# Patient Record
Sex: Male | Born: 1960 | Race: White | Hispanic: No | Smoking: Current every day smoker
Health system: Southern US, Community
[De-identification: ages and names within clinical notes are randomized; demographics above are authoritative.]

## PROBLEM LIST (undated history)

## (undated) DIAGNOSIS — E119 Type 2 diabetes mellitus without complications: Secondary | ICD-10-CM

## (undated) DIAGNOSIS — I10 Essential (primary) hypertension: Secondary | ICD-10-CM

## (undated) DIAGNOSIS — F32A Depression, unspecified: Secondary | ICD-10-CM

## (undated) DIAGNOSIS — M1990 Unspecified osteoarthritis, unspecified site: Secondary | ICD-10-CM

## (undated) DIAGNOSIS — IMO0001 Reserved for inherently not codable concepts without codable children: Secondary | ICD-10-CM

## (undated) DIAGNOSIS — F329 Major depressive disorder, single episode, unspecified: Secondary | ICD-10-CM

## (undated) DIAGNOSIS — K219 Gastro-esophageal reflux disease without esophagitis: Secondary | ICD-10-CM

## (undated) DIAGNOSIS — E785 Hyperlipidemia, unspecified: Secondary | ICD-10-CM

## (undated) DIAGNOSIS — R0602 Shortness of breath: Secondary | ICD-10-CM

## (undated) DIAGNOSIS — N200 Calculus of kidney: Secondary | ICD-10-CM

## (undated) HISTORY — PX: CYSTOSCOPY: SUR368

## (undated) HISTORY — PX: TONSILLECTOMY: SUR1361

## (undated) HISTORY — PX: CHOLECYSTECTOMY: SHX55

---

## 2013-04-25 ENCOUNTER — Emergency Department (HOSPITAL_COMMUNITY)
Admission: EM | Admit: 2013-04-25 | Discharge: 2013-04-26 | Disposition: A | Discharge status: Other Acute Inpatient Hospital | Payer: Self-pay | Attending: Emergency Medicine | Admitting: Emergency Medicine

## 2013-04-25 ENCOUNTER — Encounter (HOSPITAL_COMMUNITY): Payer: Self-pay | Admitting: Emergency Medicine

## 2013-04-25 DIAGNOSIS — F17200 Nicotine dependence, unspecified, uncomplicated: Secondary | ICD-10-CM | POA: Insufficient documentation

## 2013-04-25 DIAGNOSIS — M1990 Unspecified osteoarthritis, unspecified site: Secondary | ICD-10-CM | POA: Insufficient documentation

## 2013-04-25 DIAGNOSIS — R45851 Suicidal ideations: Secondary | ICD-10-CM

## 2013-04-25 DIAGNOSIS — F1910 Other psychoactive substance abuse, uncomplicated: Secondary | ICD-10-CM | POA: Insufficient documentation

## 2013-04-25 DIAGNOSIS — I10 Essential (primary) hypertension: Secondary | ICD-10-CM | POA: Insufficient documentation

## 2013-04-25 DIAGNOSIS — E119 Type 2 diabetes mellitus without complications: Secondary | ICD-10-CM | POA: Insufficient documentation

## 2013-04-25 DIAGNOSIS — Z79899 Other long term (current) drug therapy: Secondary | ICD-10-CM | POA: Insufficient documentation

## 2013-04-25 HISTORY — DX: Essential (primary) hypertension: I10

## 2013-04-25 HISTORY — DX: Type 2 diabetes mellitus without complications: E11.9

## 2013-04-25 HISTORY — DX: Unspecified osteoarthritis, unspecified site: M19.90

## 2013-04-25 LAB — COMPREHENSIVE METABOLIC PANEL
ALT: 24 U/L (ref 0–53)
AST: 14 U/L (ref 0–37)
Albumin: 4 g/dL (ref 3.5–5.2)
CO2: 26 mEq/L (ref 19–32)
Calcium: 10 mg/dL (ref 8.4–10.5)
Creatinine, Ser: 0.59 mg/dL (ref 0.50–1.35)
GFR calc non Af Amer: >90 mL/min (ref >90)
Sodium: 135 mEq/L (ref 135–145)
Total Protein: 7.5 g/dL (ref 6.0–8.3)

## 2013-04-25 LAB — CBC WITH DIFFERENTIAL/PLATELET
Basophils Absolute: 0 10*3/uL (ref 0.0–0.1)
Basophils Relative: 0 % (ref 0–1)
Eosinophils Absolute: 0.4 10*3/uL (ref 0.0–0.7)
Eosinophils Relative: 3 % (ref 0–5)
HCT: 47.2 % (ref 39.0–52.0)
MCH: 30.5 pg (ref 26.0–34.0)
MCHC: 34.5 g/dL (ref 30.0–36.0)
MCV: 88.4 fL (ref 78.0–100.0)
Monocytes Absolute: 1.1 10*3/uL — ABNORMAL HIGH (ref 0.1–1.0)
Platelets: 248 10*3/uL (ref 150–400)
RDW: 13.8 % (ref 11.5–15.5)
WBC: 13.3 10*3/uL — ABNORMAL HIGH (ref 4.0–10.5)

## 2013-04-25 LAB — RAPID URINE DRUG SCREEN, HOSP PERFORMED
Amphetamines: NOT DETECTED
Cocaine: NOT DETECTED
Opiates: NOT DETECTED

## 2013-04-25 LAB — ACETAMINOPHEN LEVEL: Acetaminophen (Tylenol), Serum: <15 ug/mL (ref 10–30)

## 2013-04-25 MED ORDER — LORAZEPAM 1 MG PO TABS
1.0000 mg | ORAL_TABLET | Freq: Once | ORAL | Status: AC
  Administered 2013-04-25: 1 mg via ORAL
  Filled 2013-04-25: qty 1

## 2013-04-25 MED ORDER — NICOTINE 21 MG/24HR TD PT24
21.0000 mg | MEDICATED_PATCH | Freq: Every day | TRANSDERMAL | Status: DC
  Administered 2013-04-25 – 2013-04-26 (×2): 21 mg via TRANSDERMAL
  Filled 2013-04-25 (×2): qty 1

## 2013-04-25 MED ORDER — ACETAMINOPHEN 325 MG PO TABS: 650.0000 mg | ORAL_TABLET | ORAL | Status: DC | PRN

## 2013-04-25 MED ORDER — LISINOPRIL 20 MG PO TABS
20.0000 mg | ORAL_TABLET | Freq: Every day | ORAL | Status: DC
  Administered 2013-04-25 – 2013-04-26 (×2): 20 mg via ORAL
  Filled 2013-04-25 (×2): qty 1

## 2013-04-25 MED ORDER — IBUPROFEN 200 MG PO TABS: 600.0000 mg | ORAL_TABLET | Freq: Three times a day (TID) | ORAL | Status: DC | PRN

## 2013-04-25 MED ORDER — LORAZEPAM 2 MG/ML IJ SOLN: 1.0000 mg | Freq: Once | INTRAMUSCULAR | Status: DC

## 2013-04-25 MED ORDER — ONDANSETRON HCL 4 MG PO TABS: 4.0000 mg | ORAL_TABLET | Freq: Three times a day (TID) | ORAL | Status: DC | PRN

## 2013-04-25 MED ORDER — ZOLPIDEM TARTRATE 5 MG PO TABS
5.0000 mg | ORAL_TABLET | Freq: Every evening | ORAL | Status: DC | PRN
  Administered 2013-04-25: 5 mg via ORAL
  Filled 2013-04-25: qty 1

## 2013-04-25 MED ORDER — METFORMIN HCL ER 500 MG PO TB24: 1000.0000 mg | ORAL_TABLET | Freq: Every day | ORAL | Status: DC

## 2013-04-25 MED ORDER — ALUM & MAG HYDROXIDE-SIMETH 200-200-20 MG/5ML PO SUSP: 30.0000 mL | ORAL | Status: DC | PRN

## 2013-04-25 MED ORDER — METFORMIN HCL ER 500 MG PO TB24
1000.0000 mg | ORAL_TABLET | Freq: Every day | ORAL | Status: DC
  Filled 2013-04-25: qty 2

## 2013-04-25 MED ORDER — MOMETASONE FURO-FORMOTEROL FUM 100-5 MCG/ACT IN AERO
2.0000 | INHALATION_SPRAY | Freq: Two times a day (BID) | RESPIRATORY_TRACT | Status: DC
  Administered 2013-04-25 – 2013-04-26 (×2): 2 via RESPIRATORY_TRACT
  Filled 2013-04-25: qty 8.8

## 2013-04-25 NOTE — ED Provider Notes (Signed)
  CSN: [id number]     Arrival date & time 04/25/13  1744 History     First MD Initiated Contact with Patient 04/25/13 1747     Chief Complaint  Patient presents with  . Suicidal   (Consider location/radiation/quality/duration/timing/severity/associated sxs/prior Treatment) HPI  Victor Lawrence is a 52 y.o.male with a significant PMH of diabetes, hypertension and arthritis presents to the ER with complaints of suicidal ideation of plan using rope and taking a large amount of pills. Patient informs me that he is at the end of his rope and has nothing to live for. He says that he has lost everything his job, his home, his marriage, his family. 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 He feels that he is really tired and does not have energy to get better. Inform me that if he was not here at [facility] and he would not be on this earth anymore.   Past Medical History  Diagnosis Date  . Hypertension   . Diabetes mellitus without complication   . Arthritis    Past Surgical History  Procedure Laterality Date  . Cholecystectomy    . Tonsillectomy     No family history on file. History  Substance Use Topics  . Smoking status: Current Every Day Smoker  . Smokeless tobacco: Not on file  . Alcohol Use: Yes    Review of Systems ROS is negative unless otherwise stated in the HPI  Allergies  Review of patient's allergies indicates no known allergies.  Home Medications   Current Outpatient Rx  Name  Route  Sig  Dispense  Refill  . lisinopril (PRINIVIL,ZESTRIL) 20 MG tablet   Oral   Take 20 mg by mouth daily.         . metformin (FORTAMET) 500 MG (OSM) 24 hr tablet   Oral   Take 1,000 mg by mouth at bedtime.          BP 153/87  Pulse 121  Temp(Src) 98 F (36.7 C) (Oral)  Resp 20  SpO2 94% Physical Exam  Nursing  note and vitals reviewed. Constitutional: He appears well-developed and well-nourished. No distress.  HENT:  Head: Normocephalic and atraumatic.   HEENT: Anicteric.  No pallor.  No discharge from ears, eyes, nose, or mouth.   Eyes: Pupils are equal, round, and reactive to light.  Neck: Normal range of motion. Neck supple.  Cardiovascular: Normal rate and regular rhythm.   Pulmonary/Chest: Effort normal.  Abdominal: Soft.  Neurological: He is alert.  Skin: Skin is warm and dry.  Psychiatric: He exhibits a depressed mood.    ED Course   Procedures (including critical care time)  Labs Reviewed  CBC WITH DIFFERENTIAL  COMPREHENSIVE METABOLIC PANEL  SALICYLATE LEVEL  ACETAMINOPHEN LEVEL  URINE RAPID DRUG SCREEN (HOSP PERFORMED)  ETHANOL   No results found. 1. Diabetes   2. Suicidal ideations   3. Hypertension     MDM   ACT consulted Holding orders placed Home Med reviewed Sitter bedside ordered.   Dorthula Matas, PA-C 04/25/13 1824

## 2013-04-25 NOTE — BH Assessment (Signed)
Assessment Note  Victor Lawrence is an 52 y.o. male presents as depressed but cooperative.  Pt reports that he currently lives with his sister and can return once he is medically stable.  Pt reports that "I just want to go to sleep and not wake up, I've lost everything and I just don't see any point anymore". "I quit my job, I've lost my wife, and I'm living with my sister!" Pt reports history of depression but states, "It's never been this bad, I've never gotten this far in my thoughts and my plan".  "I bought rope and picked out a tree to hang it from". Pt reports that "every addiction I could have is spiraling out of control, I'm gambling, I'm eating, and I'm self medicating with what ever I can get".  Pt is unable to contract for safety and states "I have nothing to live for and I just don't care, I feel blank and dark".  Pt reports that "recently I thought about getting a concealed weapons permit even though I'm not a gun person".      Pt denies HI/AH/VH. 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   Pt reports a long history of drug abuse since his teens to include acid, cocaine, pot, beer, benzos, oxycotin,  And "everything else I could do".  He reports that  Currently he self medicates and mainly uses opiates and marijuana.   Pt reports  That he has a history of diagnosis of major depressive disorder and panic disorder.  He has not been compliant with his medications (Prozac and Buspar) for at least 5 months, and reports "I don't even take my diabetes medicine and my high blood pressure medicine regularly because I don't see any point".     Pt referred for a psych consult for final disposition.  Axis I: Depressive Disorder NOS, Generalized Anxiety Disorder and Substance Abuse Axis II:  Deferred Axis III:  Past Medical History  Diagnosis Date  . Hypertension   . Diabetes mellitus without complication   . Arthritis    Axis IV: economic problems, housing problems, occupational problems, other psychosocial or environmental problems, problems related to social environment and problems with primary support group Axis V: 11-20 some danger of hurting self or others possible OR occasionally fails to maintain minimal personal hygiene OR gross impairment in communication  Past Medical History:  Past Medical History  Diagnosis Date  . Hypertension   . Diabetes mellitus without complication   . Arthritis     Past Surgical History  Procedure Laterality Date  . Cholecystectomy    . Tonsillectomy      Family History: No family history on file.  Social History:  reports that he has been smoking.  He does not have any smokeless tobacco history on file. He reports that  drinks alcohol. He reports that he uses illicit drugs.  Additional Social History:  Alcohol / Drug Use History of alcohol / drug use?: Yes Substance #1 Name of Substance 1: Marijuana 1 - Age of First Use: 12 1 - Amount (size/oz): few ounces 1 - Frequency: daily 1 - Duration: years 1 - Last Use / Amount: today Substance #2 Name of Substance 2: Alcohol 2 -  Age of First Use: teens 2 - Amount (size/oz): 12 oz beer 2 - Frequency: occasionally 2 - Duration: years 2 - Last Use / Amount: today Substance #3 Name of Substance 3: opiates 3 - Age of First Use: teens 3 - Amount (size/oz): varies 3 - Frequency: ongoing 3 - Duration: ongoing 3 - Last Use / Amount: last sunday  CIWA: CIWA-Ar BP: 135/77 mmHg Pulse Rate: 107 COWS:    Allergies: No Known Allergies  Home Medications:  (Not in a hospital admission)  OB/GYN Status:  No LMP for male patient.  General Assessment Data Location of Assessment: [facility] ED Is this a Tele or Face-to-Face Assessment?: Face-to-Face Is this an Initial Assessment or a  Re-assessment for this encounter?: Initial Assessment Living Arrangements: Other relatives Can pt return to current living arrangement?: Yes Admission Status: Voluntary Is patient capable of signing voluntary admission?: Yes Transfer from: Home Referral Source: Self/Family/Friend     [facility] Crisis Care Plan Living Arrangements: Other relatives     Risk to self Suicidal Ideation: Yes-Currently Present Suicidal Intent: Yes-Currently Present Is patient at risk for suicide?: Yes Suicidal Plan?: Yes-Currently Present Specify Current Suicidal Plan:  (Rope in a tree and pills) Access to Means: Yes Specify Access to Suicidal Means:  (Pt has already gotten rope & picked out tree/ has pills home) What has been your use of drugs/alcohol within the last 12 months?:  (benzos/oxycotin/marijuana/cocaine/acid/etoh/other rx pills) Previous Attempts/Gestures: Yes How many times?: 1 (2000) Triggers for Past Attempts: Unpredictable Intentional Self Injurious Behavior: None Family Suicide History: Unknown Recent stressful life event(s): Conflict (Comment);Divorce;Job Loss;Financial Problems Persecutory voices/beliefs?: No Depression: Yes Depression Symptoms: Insomnia;Isolating;Guilt;Loss of interest in usual pleasures;Feeling worthless/self pity Substance abuse history and/or treatment for substance abuse?: Yes  Risk to Others Homicidal Ideation: No Thoughts of Harm to Others: No Current Homicidal Intent: No Current Homicidal Plan: No Access to Homicidal Means: No Identified Victim: none History of harm to others?: No Assessment of Violence: None Noted Does patient have access to weapons?: No Criminal Charges Pending?: No Does patient have a court date: No  Psychosis Hallucinations: None noted Delusions: None noted  Mental Status Report Appear/Hygiene: Disheveled Eye Contact: Fair Motor Activity: Freedom of movement Speech: Logical/coherent Level of Consciousness:  Alert;Restless Mood: Depressed;Ashamed/humiliated;Despair;Empty;Guilty;Sad Affect: Depressed Anxiety Level: Minimal Thought Processes: Coherent;Relevant Judgement: Impaired Orientation: Person;Place;Time;Situation Obsessive Compulsive Thoughts/Behaviors: None  Cognitive Functioning Concentration: Decreased Memory: Recent Intact;Remote Intact IQ: Average Insight: Poor Impulse Control: Poor Appetite: Good Weight Loss: 0 Weight Gain: 40 (Pt reports gaining 40 lbs in last year) Sleep: Decreased Total Hours of Sleep: 3 Vegetative Symptoms: None  ADLScreening [facility] Assessment Services) Patient's cognitive ability adequate to safely complete daily activities?: Yes Patient able to express need for assistance with ADLs?: Yes Independently performs ADLs?: Yes (appropriate for developmental age)  Prior Inpatient Therapy Prior Inpatient Therapy: Yes Prior Therapy Dates: 2000 Prior Therapy Facilty/Provider(s):  (pt [place] Reason for Treatment:  (Depression/Substance Abuse)  Prior Outpatient Therapy Prior Outpatient Therapy: No  ADL Screening (condition at time of admission) Patient's cognitive ability adequate to safely complete daily activities?: Yes Patient able to express need for assistance with ADLs?: Yes Independently performs ADLs?: Yes (appropriate for developmental age)         Values / Beliefs Cultural Requests During Hospitalization: None Spiritual Requests During Hospitalization: None        Additional Information Does patient have medical clearance?: Yes     Disposition:  Disposition Initial Assessment Completed for this Encounter: Yes Disposition  of Patient: Other dispositions (Disposition pending psych consult) Other disposition(s): Other (Comment) (Disposition pending psych consult)  On Site Evaluation by:   Reviewed with Physician:    Lexine Baton 04/25/2013 7:55 PM

## 2013-04-25 NOTE — ED Notes (Signed)
Pt states he has suffered from depression for 25 years. Pt states 3 years ago he lost his wife and career and lost his whole life. Pt states since then he has really struggled with the depression he has been on Prozac and Buspar together and that is the medication that seemed to work the best. He ran out of his medications a while ago and he just didn't care enough to get it refilled.  Pt states his depression has become severe the past week and has really contemplated suicide. 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 Pt states he used to be addicted to both 20 years ago. Pt states he was committed to inpatient a couple times in his lifetime. He has seen psychiatrist in the past  For depression but not on a regular  Basis.

## 2013-04-25 NOTE — ED Notes (Addendum)
Pt states he is having suicidal thoughts with a plan and states he has the stuff at home(pills and a rope). This is not his first attempts, last attempt 2001. Pt also states he use opiates but haven't in a few days and smoked marijuana the other day.

## 2013-04-25 NOTE — ED Notes (Signed)
Pt wanded by security. 

## 2013-04-26 ENCOUNTER — Encounter (HOSPITAL_COMMUNITY): Payer: Self-pay | Admitting: *Deleted

## 2013-04-26 ENCOUNTER — Inpatient Hospital Stay (HOSPITAL_COMMUNITY)
Admission: AD | Admit: 2013-04-26 | Discharge: 2013-04-30 | DRG: 885 | Disposition: A | Discharge status: Home / Self Care | Payer: Self-pay | Source: Intra-hospital | Attending: Psychiatry | Admitting: Psychiatry

## 2013-04-26 DIAGNOSIS — F1920 Other psychoactive substance dependence, uncomplicated: Secondary | ICD-10-CM | POA: Diagnosis present

## 2013-04-26 DIAGNOSIS — F1120 Opioid dependence, uncomplicated: Secondary | ICD-10-CM

## 2013-04-26 DIAGNOSIS — F1994 Other psychoactive substance use, unspecified with psychoactive substance-induced mood disorder: Secondary | ICD-10-CM

## 2013-04-26 DIAGNOSIS — I10 Essential (primary) hypertension: Secondary | ICD-10-CM | POA: Diagnosis present

## 2013-04-26 DIAGNOSIS — R45851 Suicidal ideations: Secondary | ICD-10-CM

## 2013-04-26 DIAGNOSIS — F1210 Cannabis abuse, uncomplicated: Secondary | ICD-10-CM

## 2013-04-26 DIAGNOSIS — F1021 Alcohol dependence, in remission: Secondary | ICD-10-CM

## 2013-04-26 DIAGNOSIS — F332 Major depressive disorder, recurrent severe without psychotic features: Principal | ICD-10-CM | POA: Diagnosis present

## 2013-04-26 DIAGNOSIS — E119 Type 2 diabetes mellitus without complications: Secondary | ICD-10-CM | POA: Diagnosis present

## 2013-04-26 DIAGNOSIS — F411 Generalized anxiety disorder: Secondary | ICD-10-CM | POA: Diagnosis present

## 2013-04-26 DIAGNOSIS — Z79899 Other long term (current) drug therapy: Secondary | ICD-10-CM

## 2013-04-26 DIAGNOSIS — F1220 Cannabis dependence, uncomplicated: Secondary | ICD-10-CM

## 2013-04-26 LAB — HEMOGLOBIN A1C: Mean Plasma Glucose: 240 mg/dL — ABNORMAL HIGH (ref <117)

## 2013-04-26 LAB — GLUCOSE, CAPILLARY: Glucose-Capillary: 179 mg/dL — ABNORMAL HIGH (ref 70–99)

## 2013-04-26 MED ORDER — LOPERAMIDE HCL 2 MG PO CAPS
2.0000 mg | ORAL_CAPSULE | ORAL | Status: DC | PRN
Start: 2013-04-26 — End: 2013-04-30

## 2013-04-26 MED ORDER — CHLORDIAZEPOXIDE HCL 25 MG PO CAPS: 25.0000 mg | ORAL_CAPSULE | Freq: Three times a day (TID) | ORAL | Status: DC | PRN

## 2013-04-26 MED ORDER — BUSPIRONE HCL 10 MG PO TABS
10.0000 mg | ORAL_TABLET | Freq: Three times a day (TID) | ORAL | Status: DC
  Administered 2013-04-26: 10 mg via ORAL
  Filled 2013-04-26: qty 1

## 2013-04-26 MED ORDER — DICYCLOMINE HCL 20 MG PO TABS: 20.0000 mg | ORAL_TABLET | Freq: Four times a day (QID) | ORAL | Status: DC | PRN

## 2013-04-26 MED ORDER — ONDANSETRON 4 MG PO TBDP: 4.0000 mg | ORAL_TABLET | Freq: Four times a day (QID) | ORAL | Status: DC | PRN

## 2013-04-26 MED ORDER — CLONIDINE HCL 0.1 MG PO TABS
0.1000 mg | ORAL_TABLET | Freq: Four times a day (QID) | ORAL | Status: DC
  Filled 2013-04-26 (×8): qty 1

## 2013-04-26 MED ORDER — CLONIDINE HCL 0.1 MG PO TABS: 0.1000 mg | ORAL_TABLET | Freq: Every day | ORAL | Status: DC

## 2013-04-26 MED ORDER — METFORMIN HCL ER 500 MG PO TB24
1000.0000 mg | ORAL_TABLET | Freq: Every day | ORAL | Status: DC
  Administered 2013-04-26 – 2013-04-29 (×4): 1000 mg via ORAL
  Filled 2013-04-26 (×7): qty 2

## 2013-04-26 MED ORDER — LISINOPRIL 20 MG PO TABS
20.0000 mg | ORAL_TABLET | Freq: Every day | ORAL | Status: DC
Start: 2013-04-26 — End: 2013-04-30
  Administered 2013-04-27 – 2013-04-30 (×4): 20 mg via ORAL
  Filled 2013-04-26 (×8): qty 1

## 2013-04-26 MED ORDER — MOMETASONE FURO-FORMOTEROL FUM 100-5 MCG/ACT IN AERO
2.0000 | INHALATION_SPRAY | Freq: Two times a day (BID) | RESPIRATORY_TRACT | Status: DC
  Administered 2013-04-26 – 2013-04-30 (×8): 2 via RESPIRATORY_TRACT
  Filled 2013-04-26: qty 8.8

## 2013-04-26 MED ORDER — METHOCARBAMOL 500 MG PO TABS: 500.0000 mg | ORAL_TABLET | Freq: Three times a day (TID) | ORAL | Status: DC | PRN

## 2013-04-26 MED ORDER — NAPROXEN 500 MG PO TABS: 500.0000 mg | ORAL_TABLET | Freq: Two times a day (BID) | ORAL | Status: DC | PRN

## 2013-04-26 MED ORDER — HYDROXYZINE HCL 25 MG PO TABS
25.0000 mg | ORAL_TABLET | Freq: Four times a day (QID) | ORAL | Status: DC | PRN
  Administered 2013-04-26 – 2013-04-28 (×3): 25 mg via ORAL
  Filled 2013-04-26 (×2): qty 1

## 2013-04-26 MED ORDER — FLUOXETINE HCL 20 MG PO CAPS
40.0000 mg | ORAL_CAPSULE | Freq: Every day | ORAL | Status: DC
  Administered 2013-04-26: 40 mg via ORAL
  Filled 2013-04-26: qty 2

## 2013-04-26 MED ORDER — CLONIDINE HCL 0.1 MG PO TABS: 0.1000 mg | ORAL_TABLET | ORAL | Status: DC

## 2013-04-26 NOTE — Progress Notes (Signed)
Patient did attend the evening speaker AA meeting.  

## 2013-04-26 NOTE — Consult Note (Signed)
[facility] Psychiatry Consult   Reason for Consult:  ED Referral Referring Physician:  ED Providers Victor Lawrence is an 52 y.o. male.  Assessment: AXIS I:  Substance Induced Mood Disorder/opiate dependence/thc abuse vs dependence/alcohol dependence in partial remissiion/Suicidal ideations AXIS II:  Deferred AXIS III:   Past Medical History  Diagnosis Date  . Hypertension   . Diabetes mellitus without complication   . Arthritis    AXIS IV:  economic problems -quit job without other employment AXIS V:  21-30 behavior considerably influenced by delusions or hallucinations OR serious impairment in judgment, communication OR inability to function in almost all areas  Plan:  Recommend psychiatric Inpatient admission when medically cleared.  Subjective:   Victor Lawrence is a 52 y.o. male patient admitted with severe suicidal obsessions related to relapse into addiction 2 years ago.He has had good response to prozac and buspar in past but failed to fill his rx when it ran out last month.States it seems any addictive behavior has begun to flourish in him since relapse-ie reports heavy gambling addiction to sweepstakes also  HPI:  See ED admission note/Axis I-V and Subjective above.Pt lives with his sister.He grew up in a dysfunctional family with the loss of his parents at age 52 and alcoholism and drug addictiion present thruout his family. HPI Elements:   Context:  per ed Admission note pt is suicidal.  Past Psychiatric History: Past Medical History  Diagnosis Date  . Hypertension   . Diabetes mellitus without complication   . Arthritis     reports that he has been smoking.  He does not have any smokeless tobacco history on file. He reports that  drinks alcohol. He reports that he uses illicit drugs. No family history on file. 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 rate 18, SpO2 95.00%.There is no height or weight on file to calculate BMI. Results for orders placed during the hospital encounter of 04/25/13 (from the past 72 hour(s))  URINE RAPID DRUG SCREEN (HOSP PERFORMED)     Status: Abnormal   Collection Time    04/25/13  6:27 PM      Result Value Range   Opiates NONE DETECTED  NONE DETECTED   Cocaine NONE DETECTED  NONE DETECTED   Benzodiazepines NONE DETECTED  NONE DETECTED   Amphetamines NONE DETECTED  NONE DETECTED   Tetrahydrocannabinol POSITIVE (*) NONE DETECTED   Barbiturates NONE DETECTED  NONE DETECTED   Comment:            DRUG SCREEN FOR MEDICAL PURPOSES     ONLY.  IF CONFIRMATION IS NEEDED     FOR ANY PURPOSE, NOTIFY LAB     WITHIN 5 DAYS.                LOWEST DETECTABLE LIMITS     FOR URINE DRUG SCREEN  Drug Class       Cutoff (ng/mL)     Amphetamine      1000     Barbiturate      200     Benzodiazepine   200     Tricyclics       300     Opiates          300     Cocaine          300     THC              50  CBC WITH DIFFERENTIAL     Status: Abnormal   Collection Time    04/25/13  7:41 PM      Result Value Range   WBC 13.3 (*) 4.0 - 10.5 K/uL   RBC 5.34  4.22 - 5.81 MIL/uL   Hemoglobin 16.3  13.0 - 17.0 g/dL   HCT 91.4  78.2 - 95.6 %   MCV 88.4  78.0 - 100.0 fL   MCH 30.5  26.0 - 34.0 pg   MCHC 34.5  30.0 - 36.0 g/dL   RDW 21.3  08.6 - 57.8 %   Platelets 248  150  - 400 K/uL   Neutrophils Relative % 58  43 - 77 %   Neutro Abs 7.8 (*) 1.7 - 7.7 K/uL   Lymphocytes Relative 31  12 - 46 %   Lymphs Abs 4.1 (*) 0.7 - 4.0 K/uL   Monocytes Relative 8  3 - 12 %   Monocytes Absolute 1.1 (*) 0.1 - 1.0 K/uL   Eosinophils Relative 3  0 - 5 %   Eosinophils Absolute 0.4  0.0 - 0.7 K/uL   Basophils Relative 0  0 - 1 %   Basophils Absolute 0.0  0.0 - 0.1 K/uL  COMPREHENSIVE METABOLIC PANEL     Status: Abnormal   Collection Time    04/25/13  7:41 PM      Result Value Range   Sodium 135  135 - 145 mEq/L   Potassium 4.2  3.5 - 5.1 mEq/L   Chloride 98  96 - 112 mEq/L   CO2 26  19 - 32 mEq/L   Glucose, Bld 186 (*) 70 - 99 mg/dL   BUN 15  6 - 23 mg/dL   Creatinine, Ser 4.69  0.50 - 1.35 mg/dL   Calcium 62.9  8.4 - 52.8 mg/dL   Total Protein 7.5  6.0 - 8.3 g/dL   Albumin 4.0  3.5 - 5.2 g/dL   AST 14  0 - 37 U/L   ALT 24  0 - 53 U/L   Alkaline Phosphatase 59  39 - 117 U/L   Total Bilirubin 0.4  0.3 - 1.2 mg/dL   GFR calc non Af Amer >90  >90 mL/min   GFR calc Af Amer >90  >90 mL/min   Comment: (NOTE)     The eGFR has been calculated using the CKD EPI equation.     This calculation has not been validated in all clinical situations.     eGFR's persistently <90 mL/min signify possible Chronic Kidney     Disease.  SALICYLATE LEVEL     Status: Abnormal   Collection Time    04/25/13  7:41 PM      Result Value Range   Salicylate Lvl <2.0 (*) 2.8 - 20.0 mg/dL  ACETAMINOPHEN LEVEL     Status: None   Collection Time    04/25/13  7:41  PM      Result Value Range   Acetaminophen (Tylenol), Serum <15.0  10 - 30 ug/mL   Comment:            THERAPEUTIC CONCENTRATIONS VARY     SIGNIFICANTLY. A RANGE OF 10-30     ug/mL MAY BE AN EFFECTIVE     CONCENTRATION FOR MANY PATIENTS.     HOWEVER, SOME ARE BEST TREATED     AT CONCENTRATIONS OUTSIDE THIS     RANGE.     ACETAMINOPHEN CONCENTRATIONS     >150 ug/mL AT 4 HOURS AFTER     INGESTION AND >50 ug/mL AT 12     HOURS  AFTER INGESTION ARE     OFTEN ASSOCIATED WITH TOXIC     REACTIONS.  ETHANOL     Status: None   Collection Time    04/25/13  7:41 PM      Result Value Range   Alcohol, Ethyl (B) <11  0 - 11 mg/dL   Comment:            LOWEST DETECTABLE LIMIT FOR     SERUM ALCOHOL IS 11 mg/dL     FOR MEDICAL PURPOSES ONLY   Labs are reviewed and are pertinent for THC in urine and ^ glucose  Current Facility-Administered Medications  Medication Dose Route Frequency Provider Last Rate Last Dose  . acetaminophen (TYLENOL) tablet 650 mg  650 mg Oral Q4H PRN Dorthula Matas, PA-C      . alum & mag hydroxide-simeth (MAALOX/MYLANTA) 200-200-20 MG/5ML suspension 30 mL  30 mL Oral PRN Dorthula Matas, PA-C      . ibuprofen (ADVIL,MOTRIN) tablet 600 mg  600 mg Oral Q8H PRN Dorthula Matas, PA-C      . lisinopril (PRINIVIL,ZESTRIL) tablet 20 mg  20 mg Oral Daily Dorthula Matas, PA-C   20 mg at 04/25/13 2217  . metFORMIN (GLUCOPHAGE-XR) 24 hr tablet 1,000 mg  1,000 mg Oral Q supper Tiffany Irine Seal, PA-C      . mometasone-formoterol (DULERA) 100-5 MCG/ACT inhaler 2 puff  2 puff Inhalation BID Dorthula Matas, PA-C   2 puff at 04/25/13 2213  . nicotine (NICODERM CQ - dosed in mg/24 hours) patch 21 mg  21 mg Transdermal Daily Dorthula Matas, PA-C   21 mg at 04/25/13 2021  . ondansetron (ZOFRAN) tablet 4 mg  4 mg Oral Q8H PRN Dorthula Matas, PA-C      . zolpidem (AMBIEN) tablet 5 mg  5 mg Oral QHS PRN Dorthula Matas, PA-C   5 mg at 04/25/13 2217   Current Outpatient Prescriptions  Medication Sig Dispense Refill  . lisinopril (PRINIVIL,ZESTRIL) 20 MG tablet Take 20 mg by mouth daily.      . metformin (FORTAMET) 500 MG (OSM) 24 hr tablet Take 1,000 mg by mouth at bedtime.      . mometasone-formoterol (DULERA) 100-5 MCG/ACT AERO Inhale 2 puffs into the lungs 2 (two) times daily.        Psychiatric Specialty Exam:     Blood pressure 100/62, pulse 87, temperature 98.3 F (36.8 C), temperature source Oral,  resp. rate 18, SpO2 95.00%.There is no height or weight on file to calculate BMI.  General Appearance: Fairly Groomed  Patent attorney::  Fair  Speech:  Clear and Coherent  Volume:  Normal  Mood:  Dysphoric/anergic  Affect:  Congruent  Thought Process:  Coherent  Orientation:  Full (Time, Place, and Person)  Thought Content:  Obsessions with suicidal thought  Suicidal Thoughts:  Yes.  with intent/plan  Homicidal Thoughts:  No  Memory:  Negative  Judgement:  Impaired  Insight:  Lacking  Psychomotor Activity:  Decreased  Concentration:  Fair  Recall:  Good  Akathisia:  NA  Handed:  Right  AIMS (if indicated):     Assets:  Social support  Sleep:   poor   Treatment Plan Summary: admit for inpatient traetment of addictions and depression  Court Joy 04/26/2013 3:43 AM

## 2013-04-26 NOTE — ED Provider Notes (Signed)
Medical screening examination/treatment/procedure(s) were performed by non-physician practitioner and as supervising physician I was immediately available for consultation/collaboration.   Junius Argyle, MD 04/26/13 [id number]

## 2013-04-26 NOTE — ED Notes (Signed)
Up to the bathroom, ambulatory w/o difficulty 

## 2013-04-26 NOTE — Progress Notes (Signed)
Patient ID: Victor Lawrence, male   DOB: 18-Aug-1961, 52 y.o.   MRN: [id number] [birth year] @ 1442 nursing admission: pt came to [facility] voluntarily with an admitting dx of depressive d/o nos, generalized anxiety d/o and substance abuse. He was suicidal with a plan to OD or hang himself. He was able to contract for SI on admission and denied any hi/av. Also stated he had been non complaint with his medications. He has been abusing opiates, MJ and etoh he stated, but mostly the opiates. Last use of these substances was approximately on 04-25-13.  He has a hx of diabetes and his CBG on admission was 179 at 1350. On admission he had no pain, stated he was constipated, wears glasses, wears upper dentures, is a smoker x2 ppd, wearing a nicotine patch and had no allergies. He is not a fall risk and has a medial hx of arthritis, diabetes, gallbladder removed, htn and his tonsils. He was polite/cooperative on adm and escorted to the 300 hall. Report was given to luanne,RN.

## 2013-04-26 NOTE — ED Notes (Signed)
Up to the bathroom 

## 2013-04-26 NOTE — ED Notes (Signed)
Psych and NP into see 

## 2013-04-26 NOTE — ED Notes (Signed)
Pt ambulatory to [facility] w/ security and mHt, belongings sent w/ mHt 

## 2013-04-26 NOTE — Tx Team (Signed)
Initial Interdisciplinary Treatment Plan  PATIENT STRENGTHS: (choose at least two) Average or above average intelligence Communication skills General fund of knowledge Motivation for treatment/growth Supportive family/friends  PATIENT STRESSORS: Marital or family conflict Substance abuse   PROBLEM LIST: Problem List/Patient Goals Date to be addressed Date deferred Reason deferred Estimated date of resolution  Depressive d/o  04-26-13     GAD 04-26-13     Substance abuse/dependece 04-26-13     si with plan  04-26-13                                    DISCHARGE CRITERIA:  Improved stabilization in mood, thinking, and/or behavior Reduction of life-threatening or endangering symptoms to within safe limits Verbal commitment to aftercare and medication compliance Withdrawal symptoms are absent or subacute and managed without 24-hour nursing intervention  PRELIMINARY DISCHARGE PLAN: Attend aftercare/continuing care group Attend 12-step recovery group Return to previous living arrangement  PATIENT/FAMIILY INVOLVEMENT: This treatment plan has been presented to and reviewed with the patient, Victor Lawrence, and/or family member, .  The patient and family have been given the opportunity to ask questions and make suggestions.  Victor Lawrence 04/26/2013, 2:42 PM

## 2013-04-26 NOTE — ED Notes (Signed)
NAD, watching tv, pt is aware that he will transport to [facility] shortly.

## 2013-04-26 NOTE — Consult Note (Signed)
Agree with plan 

## 2013-04-27 ENCOUNTER — Encounter (HOSPITAL_COMMUNITY): Payer: Self-pay | Admitting: Psychiatry

## 2013-04-27 DIAGNOSIS — F411 Generalized anxiety disorder: Secondary | ICD-10-CM

## 2013-04-27 DIAGNOSIS — F1920 Other psychoactive substance dependence, uncomplicated: Secondary | ICD-10-CM | POA: Diagnosis present

## 2013-04-27 DIAGNOSIS — F332 Major depressive disorder, recurrent severe without psychotic features: Secondary | ICD-10-CM | POA: Diagnosis present

## 2013-04-27 MED ORDER — FLUOXETINE HCL 20 MG PO CAPS
20.0000 mg | ORAL_CAPSULE | Freq: Every day | ORAL | Status: DC
  Administered 2013-04-27 – 2013-04-29 (×3): 20 mg via ORAL
  Filled 2013-04-27 (×5): qty 1

## 2013-04-27 MED ORDER — TRAZODONE HCL 100 MG PO TABS
100.0000 mg | ORAL_TABLET | Freq: Every evening | ORAL | Status: DC | PRN
  Administered 2013-04-27: 100 mg via ORAL
  Filled 2013-04-27: qty 1

## 2013-04-27 MED ORDER — HYDROXYZINE HCL 50 MG PO TABS
50.0000 mg | ORAL_TABLET | Freq: Every evening | ORAL | Status: DC | PRN
  Filled 2013-04-27: qty 14

## 2013-04-27 MED ORDER — NICOTINE 21 MG/24HR TD PT24
MEDICATED_PATCH | TRANSDERMAL | Status: AC
  Filled 2013-04-27: qty 1

## 2013-04-27 MED ORDER — NICOTINE 21 MG/24HR TD PT24
21.0000 mg | MEDICATED_PATCH | Freq: Every day | TRANSDERMAL | Status: DC
  Administered 2013-04-27: 21 mg via TRANSDERMAL
  Filled 2013-04-27 (×3): qty 1

## 2013-04-27 MED ORDER — BUSPIRONE HCL 5 MG PO TABS
15.0000 mg | ORAL_TABLET | Freq: Two times a day (BID) | ORAL | Status: DC
  Administered 2013-04-27 – 2013-04-30 (×7): 15 mg via ORAL
  Filled 2013-04-27 (×11): qty 1

## 2013-04-27 NOTE — [facility] Group Notes (Signed)
[facility] Group Notes: (Clinical Social Work)   04/27/2013      Type of Therapy:  Group Therapy   Participation Level:  Did Not Attend - came to the beginning of group, stated he does attend AA and that is his main support system, then left room and did not return.   Ambrose Mantle, LCSW 04/27/2013, 12:59 PM

## 2013-04-27 NOTE — Progress Notes (Addendum)
Patient ID: Victor Lawrence, male   DOB: 1960-11-28, 52 y.o.   MRN: [id number] D: Pt is awake and active on the unit this AM. Pt denies SI/HI and A/V hallucinations. Pt rates their depression at 9 and hopelessness at 9. Pt's most recent COWS score was 1. Pt mood is depressed and his affect is blunted. Pt has been preoccupied with getting his medications continued which was accomplished today. Pt is only attending a few groups and does not stay the entire duration. Pt is somewhat isolative but he is cooperative with staff.   A: Encouraged pt to discuss feelings with staff and administered medication per MD orders. Writer also encouraged pt to participate in groups.  R: Pt is attending groups and tolerating medications well. Writer will continue to monitor. 15 minute checks are ongoing for safety.

## 2013-04-27 NOTE — [facility] Suicide Risk Assessment (Signed)
Suicide Risk Assessment  Admission Assessment     Nursing information obtained from:  Patient Demographic factors:  Male;Divorced or widowed;Caucasian;Unemployed Current Mental Status:  Suicidal ideation indicated by patient Loss Factors:  Decrease in vocational status;Financial problems / change in socioeconomic status Historical Factors:  Family history of mental illness or substance abuse;Impulsivity Risk Reduction Factors:  Sense of responsibility to family;Religious beliefs about death;Living with another person, especially a relative;Positive social support  CLINICAL FACTORS:   Severe Anxiety and/or Agitation Depression:   Anhedonia Comorbid alcohol abuse/dependence Hopelessness Insomnia Severe Alcohol/Substance Abuse/Dependencies  COGNITIVE FEATURES THAT CONTRIBUTE TO RISK:  Closed-mindedness Polarized thinking Thought constriction (tunnel vision)    SUICIDE RISK:   Moderate:  Frequent suicidal ideation with limited intensity, and duration, some specificity in terms of plans, no associated intent, good self-control, limited dysphoria/symptomatology, some risk factors present, and identifiable protective factors, including available and accessible social support.  PLAN OF CARE: Supportive approach/coping skills/relapse prevention                                Identify Detox needs                               Reassess and address the co morbidities                               Resume Prozac/Buspar  I certify that inpatient services furnished can reasonably be expected to improve the patient's condition.  Ardit Danh A 04/27/2013, 2:48 PM
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
## 2013-04-27 NOTE — Progress Notes (Signed)
Patient did attend the evening speaker AA meeting.  

## 2013-04-27 NOTE — [facility] Notes (Signed)
Adult Psychoeducational Group Note  Date:  04/27/2013 Time:  0900  Group Topic/Focus:  Self Inventory  Participation Level:  Did Not Attend   Victor Lawrence 04/27/2013, 10:34 AM 
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
## 2013-04-27 NOTE — [facility] Notes (Signed)
Psychoeducational Group Note  Date:  04/27/2013 Time:  0945 am  Group Topic/Focus:  Making Healthy Choices:   The focus of this group is to help patients identify negative/unhealthy choices they were using prior to admission and identify positive/healthier coping strategies to replace them upon discharge.  Participation Level:  Active  Participation Quality:  Appropriate, Attentive, Sharing and Supportive  Affect:  Appropriate  Cognitive:  Alert and Appropriate  Insight:  Engaged  Engagement in Group:  Engaged  Additional Comments:    Andrena Mews 04/27/2013, 10:29 AM

## 2013-04-27 NOTE — [facility] Notes (Addendum)
Patient ID: Victor Lawrence, male   DOB: [birth year], 52 y.o.   MRN: [id number] D: Client resting in bed with eyes closed; unlabored breathing noted. C/o anxiety  A: Q 15 minute safety checks completed. Administered PRN vistaril.  R: Safety maintained. Pt returned to sleep.

## 2013-04-28 DIAGNOSIS — F1110 Opioid abuse, uncomplicated: Secondary | ICD-10-CM

## 2013-04-28 DIAGNOSIS — F329 Major depressive disorder, single episode, unspecified: Secondary | ICD-10-CM

## 2013-04-28 DIAGNOSIS — F1210 Cannabis abuse, uncomplicated: Secondary | ICD-10-CM

## 2013-04-28 MED ORDER — TRAZODONE HCL 50 MG PO TABS
50.0000 mg | ORAL_TABLET | Freq: Every evening | ORAL | Status: DC | PRN
  Administered 2013-04-28 – 2013-04-29 (×2): 50 mg via ORAL
  Filled 2013-04-28: qty 14
  Filled 2013-04-28: qty 1

## 2013-04-28 MED ORDER — NICOTINE 14 MG/24HR TD PT24
14.0000 mg | MEDICATED_PATCH | Freq: Every day | TRANSDERMAL | Status: DC
  Administered 2013-04-29 – 2013-04-30 (×2): 14 mg via TRANSDERMAL
  Filled 2013-04-28 (×6): qty 1

## 2013-04-28 MED ORDER — NICOTINE 14 MG/24HR TD PT24
MEDICATED_PATCH | TRANSDERMAL | Status: AC
  Administered 2013-04-28: 14 mg via TRANSDERMAL
  Filled 2013-04-28: qty 1

## 2013-04-28 NOTE — [facility] Notes (Signed)
Report received from Stefanie Libel RN. Patient currently lying in bed asleep with eyes closed and no distress noted. Safety maintained with 15 min checks, wll continue to monitor.

## 2013-04-28 NOTE — [facility] Notes (Signed)
Patient ID: Victor Lawrence, male   DOB: 1961-03-16, 52 y.o.   MRN: [id number] He has been up and to groups interacting with peers and staff. Stated that he still has fleeing thoughts to harm self. Says that" he would not act on them".  +

## 2013-04-28 NOTE — [facility] Group Notes (Signed)
[facility] LCSW [facility] Discharge Planning Group Note   04/28/2013 8:45 AM  Participation Quality:  Alert and Appropriate   Mood/Affect:  Appropriate  Depression Rating:  6-7  Anxiety Rating:  6-7  Thoughts of Suicide:  Pt denies SI/HI  Will you contract for safety?   Yes  Current AVH:  Pt denies  Plan for Discharge/Comments:  Pt attended discharge planning group and actively participated in group.  CSW provided pt with today's workbook.  Pt states that he came here for suicidal ideation.  Pt states that he lives in [place] and will return home.  Pt states that he is feeling better today.  Pt doesn't have follow up; CSW will assess for appropriate referrals.  No further needs voiced by pt at this time.    Transportation Means: Pt reports access to transportation  Supports: No supports mentioned at this time  Reyes Ivan, LCSWA 04/28/2013 9:41 AM

## 2013-04-28 NOTE — Tx Team (Signed)
Interdisciplinary Treatment Plan Update (Adult)  Date: 04/28/2013  Time Reviewed:  9:45 AM  Progress in Treatment: Attending groups: Yes Participating in groups:  Yes Taking medication as prescribed:  Yes Tolerating medication:  Yes Family/Significant othe contact made: CSW assessing Patient understands diagnosis:  Yes Discussing patient identified problems/goals with staff:  Yes Medical problems stabilized or resolved:  Yes Denies suicidal/homicidal ideation: Yes Issues/concerns per patient self-inventory:  Yes Other:  New problem(s) identified: N/A  Discharge Plan or Barriers: CSW assessing for appropriate referrals.   Reason for Continuation of Hospitalization:  Anxiety Depression Medication Stabilization  Comments: N/A  Estimated length of stay: 3-5 days  For review of initial/current patient goals, please see plan of care.  Attendees: Patient:     Family:     Physician:   04/28/2013 10:11 AM   Nursing:   Roswell Miners, RN 04/28/2013 10:11 AM   Clinical Social Worker:  Reyes Ivan, LCSWA 04/28/2013 10:11 AM   Other: Manuela Schwartz, RN 04/28/2013 10:11 AM   Other:  Trula Slade, LCSWA 04/28/2013 10:11 AM   Other:  Onnie Boer, RN case manager 04/28/2013 10:11 AM   Other:  Serena Colonel, NP 04/28/2013 10:11 AM   Other:    Other:    Other:    Other:    Other:    Other:     Scribe for Treatment Team:   Carmina Miller, 04/28/2013 , 10:11 AM

## 2013-04-28 NOTE — Clinical Social Work Note (Signed)
CSW met with pt individually after group. Pt states that he spoke to Dr. Dub Mikes yesterday about [facility], and that it should have been relayed to CSW today that this is what he wants.  CSW explained that Dr. Dub Mikes was off today but that it was fine, CSW would get info on [facility].  CSW then asked pt what brought him to the hospital and his substance use history, to best help him in making referrals.  Pt became very agitated, stating that CSW should have read his chart and already know what is going on with pt, prior to meeting with pt.  Pt requesting a new CSW.    Victor Lawrence, [place] 04/28/2013  2:55 PM

## 2013-04-28 NOTE — [facility] Group Notes (Signed)
[facility] LCSW Group Therapy  04/28/2013  1:15 PM   Type of Therapy:  Group Therapy  Participation Level:  Active  Participation Quality:  Appropriate and Attentive  Affect:  Appropriate  Cognitive:  Alert and Appropriate  Insight:  Developing/Improving and Engaged  Engagement in Therapy:  Developing/Improving and Engaged  Modes of Intervention:  Clarification, Confrontation, Discussion, Education, Exploration, Limit-setting, Orientation, Problem-solving, Rapport Building, Dance movement psychotherapist, Socialization and Support  Summary of Progress/Problems: Pt identified obstacles faced currently and processed barriers involved in overcoming these obstacles. Pt identified steps necessary for overcoming these obstacles and explored motivation (internal and external) for facing these difficulties head on. Pt further identified one area of concern in their lives and chose a goal to focus on for today.  Pt shared that his biggest obstacle is always his "thoughts when alone and his tongue when in a crowd".  Pt states that he needs to change his ways of thinking and accept help from the professionals, as he often thinks he is the expert and self diagnosis, which leads to self sabotage.  Pt states that he plans to be open and honest while here.  Pt actively participated and was engaged in group discussion.    Victor Lawrence, [place] 04/28/2013 2:24 PM

## 2013-04-28 NOTE — Progress Notes (Signed)
[facility] MD Progress Note  04/28/2013 6:36 PM Victor Lawrence  MRN:  [id number]  Subjective: Victor Lawrence reports, "My depression is lifting, but I feel frustrated. I'm focusing on getting well again. The Md informed me of a place that I can go for 1 year to get treatment. But the Social worker is the key person to get me the information about this place called [facility]. However, the social worker seem inattentive to my needs. The trazodone 100 mg is a bit much. I need the dose decreased".  Diagnosis:   DSM5: Schizophrenia Disorders:   Obsessive-Compulsive Disorders:   Trauma-Stressor Disorders:   Substance/Addictive Disorders:  Alcohol Withdrawal (291.81), Cannabis Use Disorder - Moderate 9304.30) and Opioid Disorder - Moderate (304.00) Depressive Disorders:  Major Depressive Disorder - Moderate (296.22)  Axis I: Opioid disorder, Cannabis abuse, MDD Axis II: Deferred Axis III:  Past Medical History  Diagnosis Date  . Hypertension   . Diabetes mellitus without complication   . Arthritis    Axis IV: Polysubstance absue Axis V: 41-50 serious symptoms  ADL's:  Intact  Sleep: Good  Appetite:  Good  Suicidal Ideation:  Plan:  Denies Intent:  Denies Means:  Denies Homicidal Ideation:  Plan:  Denies Intent:  Denies Means:  Denies AEB (as evidenced by):  Psychiatric Specialty Exam: Review of Systems  Constitutional: Negative.   HENT: Negative.   Eyes: Negative.   Respiratory: Negative.   Cardiovascular: Negative.   Skin: Negative.   Neurological: Positive for tremors.  Endo/Heme/Allergies: Negative.   Psychiatric/Behavioral: Positive for depression. Negative for suicidal ideas, hallucinations and memory loss. The patient is nervous/anxious and has insomnia.     Blood pressure 152/91, pulse 84, temperature 96.9 F (36.1 C), temperature source Oral, resp. rate 20, height 5\' 10"  (1.778 m), weight 124.739 kg (275 lb), SpO2 96.00%.Body mass index is 39.46 kg/(m^2).  General  Appearance: Casual and Fairly Groomed  Patent attorney::  Good  Speech:  Clear and Coherent and Normal Rate  Volume:  Normal  Mood:  Frustrated  Affect:  Restricted  Thought Process:  Coherent, Goal Directed and Intact  Orientation:  Full (Time, Place, and Person)  Thought Content:  Rumination  Suicidal Thoughts:  No  Homicidal Thoughts:  No  Memory:  Immediate;   Good Recent;   Good Remote;   Good  Judgement:  Fair  Insight:  Good  Psychomotor Activity:  Normal  Concentration:  Fair  Recall:  Good  Akathisia:  No  Handed:  Right  AIMS (if indicated):     Assets:  Communication Skills Desire for Improvement  Sleep:  Number of Hours: 6.25   Current Medications: Current Facility-Administered Medications  Medication Dose Route Frequency Provider Last Rate Last Dose  . busPIRone (BUSPAR) tablet 15 mg  15 mg Oral BID Rachael Fee, MD   15 mg at 04/28/13 1710  . chlordiazePOXIDE (LIBRIUM) capsule 25 mg  25 mg Oral TID PRN Nanine Means, NP      . dicyclomine (BENTYL) tablet 20 mg  20 mg Oral Q6H PRN Nanine Means, NP      . FLUoxetine (PROZAC) capsule 20 mg  20 mg Oral Daily Rachael Fee, MD   20 mg at 04/28/13 4259  . hydrOXYzine (ATARAX/VISTARIL) tablet 25 mg  25 mg Oral Q6H PRN Nanine Means, NP   25 mg at 04/28/13 1431  . hydrOXYzine (ATARAX/VISTARIL) tablet 50 mg  50 mg Oral QHS PRN Rachael Fee, MD      . lisinopril (PRINIVIL,ZESTRIL)  tablet 20 mg  20 mg Oral Daily Nanine Means, NP   20 mg at 04/28/13 0819  . loperamide (IMODIUM) capsule 2-4 mg  2-4 mg Oral PRN Nanine Means, NP      . metFORMIN (GLUCOPHAGE-XR) 24 hr tablet 1,000 mg  1,000 mg Oral Q supper Nanine Means, NP   1,000 mg at 04/28/13 1711  . mometasone-formoterol (DULERA) 100-5 MCG/ACT inhaler 2 puff  2 puff Inhalation BID Nanine Means, NP   2 puff at 04/28/13 0817  . naproxen (NAPROSYN) tablet 500 mg  500 mg Oral BID PRN Nanine Means, NP      . nicotine (NICODERM CQ - dosed in mg/24 hours) patch 14 mg  14 mg  Transdermal Q0600 Sanjuana Kava, NP   14 mg at 04/28/13 1100  . ondansetron (ZOFRAN-ODT) disintegrating tablet 4 mg  4 mg Oral Q6H PRN Nanine Means, NP      . traZODone (DESYREL) tablet 100 mg  100 mg Oral QHS PRN Rachael Fee, MD   100 mg at 04/27/13 2226    Lab Results:  Results for orders placed during the hospital encounter of 04/26/13 (from the past 48 hour(s))  GLUCOSE, CAPILLARY     Status: Abnormal   Collection Time    04/27/13 11:37 AM      Result Value Range   Glucose-Capillary 219 (*) 70 - 99 mg/dL    Physical Findings: AIMS: Facial and Oral Movements Muscles of Facial Expression: None, normal Lips and Perioral Area: None, normal Jaw: None, normal Tongue: None, normal,Extremity Movements Upper (arms, wrists, hands, fingers): None, normal Lower (legs, knees, ankles, toes): None, normal, Trunk Movements Neck, shoulders, hips: None, normal, Overall Severity Severity of abnormal movements (highest score from questions above): None, normal Incapacitation due to abnormal movements: None, normal Patient's awareness of abnormal movements (rate only patient's report): No Awareness, Dental Status Current problems with teeth and/or dentures?: No Does patient usually wear dentures?: Yes (upper dentures. )  CIWA:  CIWA-Ar Total: 0 COWS:  COWS Total Score: 0  Treatment Plan Summary: Daily contact with patient to assess and evaluate symptoms and progress in treatment Medication management  Plan: Supportive approach/coping skills/relapse prevention. Decreased Trazodone from 100 mg to 50 mg Q bedtime for sleep. Inquire SW about [facility] about [facility]. Encouraged out of room, participation in group sessions and application of coping skills when distressed. Will continue to monitor response to/adverse effects of medications in use to assure effectiveness. Continue to monitor mood, behavior and interaction with staff and other patients. Continue current plan  of care.  Medical Decision Making Problem Points:  Established problem, stable/improving (1), Review of last therapy session (1) and Review of psycho-social stressors (1) Data Points:  Review of medication regiment & side effects (2) Review of new medications or change in dosage (2)  I certify that inpatient services furnished can reasonably be expected to improve the patient's condition.   Sanjuana Kava, PMHNP-BC 04/28/2013, 6:36 PM

## 2013-04-28 NOTE — Progress Notes (Signed)
The focus of this group is to help patients review their daily goal of treatment and discuss progress on daily workbooks.  Victor Lawrence attended the A/A meeting tonight.

## 2013-04-29 DIAGNOSIS — F1020 Alcohol dependence, uncomplicated: Secondary | ICD-10-CM

## 2013-04-29 DIAGNOSIS — F1220 Cannabis dependence, uncomplicated: Secondary | ICD-10-CM

## 2013-04-29 DIAGNOSIS — F322 Major depressive disorder, single episode, severe without psychotic features: Secondary | ICD-10-CM

## 2013-04-29 LAB — GLUCOSE, CAPILLARY: Glucose-Capillary: 157 mg/dL — ABNORMAL HIGH (ref 70–99)

## 2013-04-29 MED ORDER — CITALOPRAM HYDROBROMIDE 10 MG PO TABS
10.0000 mg | ORAL_TABLET | Freq: Every day | ORAL | Status: DC
  Administered 2013-04-29 – 2013-04-30 (×2): 10 mg via ORAL
  Filled 2013-04-29 (×4): qty 1

## 2013-04-29 MED ORDER — METFORMIN HCL ER 500 MG PO TB24
500.0000 mg | ORAL_TABLET | Freq: Every day | ORAL | Status: DC
  Administered 2013-04-29 – 2013-04-30 (×2): 500 mg via ORAL
  Filled 2013-04-29 (×4): qty 1

## 2013-04-29 NOTE — Progress Notes (Signed)
Recreation Therapy Notes  Date: 08.26.2014 Time: 2:30pm Location: [address] Dayroom  Group Topic: Animal Assisted Activities  Goal Area(s) Addresses:  Patient will interact appropriately with dog team.    Behavioral Response: Appropriate, Attentive, Engaged, Intersted  Education: Coping Skill   Education Outcome: Acknowledges understanding  Clinical Observations/Feedback: Dog Team: [employer]. Patient interacted appropriately with peer, dog team, LRT and MHT. Patient asked appropriate questions about [facility] and his training, as well as inquired about the handler's training as well.   Marykay Lex Raynisha Avilla, LRT/CTRS  Jearl Klinefelter 04/29/2013 4:36 PM

## 2013-04-29 NOTE — Progress Notes (Signed)
Patient ID: Victor Lawrence, male   DOB: 03-30-1961, 52 y.o.   MRN: [id number] He has been up and to groups interacting with peers and staff. Stated that he was feeling much better and was not having any withdrawal symptoms.

## 2013-04-29 NOTE — Progress Notes (Signed)
Recreation Therapy Notes  Date: 08.26.2014 Time: 3:00pm Location: [address] Dayroom  Group Topic: Communication, Team Building, Problem Solving  Goal Area(s) Addresses:  Patient will be able to recognize use of communication, team building and problem solving during course of group activity.  Patient will verbalize qualities used to make decisions during group session.  Patient will verbalize ability to use skills to build healthy support system post d/c.   Behavioral Response: Appropriate, Attentive, Engaged, Supportive  Intervention: Problem Solving Activity.  Activity: Life Boat. Patients were given a scenario about being caught on a sinking ship. In this scenario patients were informed all members of group session, in addition to 8 individuals off of a list of 15 could fit on the life boat. List of individuals included people from all socioeconomic status, for example: President Obama, Midwife, Runner, broadcasting/film/video, Education officer, museum.    Education: LIfe Skills, Discharge Planning   Education Outcome: Acknowledges understanding  Clinical Observations/Feedback: Patient contributed to opening contributing to group definition of recovery. Patient actively participated in group activity, giving justification for individuals he wanted to put on life boat, as well as debating with peer appropriately. Patient contributed to wrap up discussion, identifying skill set and survival instinct as qualities needed for decision making process. Patient identified team work as a necessary skill to make activity a success. In addition to contributing to wrap up discussion patient actively listened to peer share personal stories related to group topic as well as offer support when needed.    Marykay Lex Xaria Judon, LRT/CTRS  Christena Sunderlin L 04/29/2013 4:00 PM

## 2013-04-29 NOTE — Progress Notes (Signed)
The focus of this group is to educate the patient on the purpose and policies of crisis stabilization and provide a format to answer questions about their admission.  The group details unit policies and expectations of patients while admitted. Patient was engaging and receptive.

## 2013-04-29 NOTE — Progress Notes (Signed)
NUTRITION ASSESSMENT  Pt identified as at risk on the Malnutrition Screen Tool  INTERVENTION: 1. Educated patient on the importance of nutrition and encouraged intake of food and beverages. 2.  Instructed patient on DM diet.  Teach back method used.  Handout on CHO counting and label reading provided.  Patient stated that he is now ready to make some changes and has modified diet since being admitted.  Feels that his depression has improved.   NUTRITION DIAGNOSIS: Food and nutrition related knowledge deficit related to limited prior knowledge AEB patient report.  Goal: Pt to meet >/= 90% of their estimated nutrition needs. Patient to verbalize diet guidelines.  Monitor:  PO intake  Assessment:  Patient admitted with SI and MDD mixed.  Opiod, ETOH, and pot use.  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  Hx of DM on Glucophage.  HgbA1C=10 8/23.  Patient states that this A1C was a "wake up call".  Stated that for the last 6 months he was depressed and "I didn't care about anything."  Drank regular soda and drinks.  "Wished the DM would kill me."  Requesting further DM education.  52 y.o. male  Height: Ht Readings from Last 1 Encounters:  04/26/13 5\' 10"  (1.778 m)    Weight: Wt Readings from Last 1 Encounters:  04/26/13 275 lb (124.739 kg)    Weight Hx: Wt Readings from Last 10 Encounters:  04/26/13 275 lb (124.739 kg)    BMI:  Body mass index is 39.46 kg/(m^2). Pt meets criteria for obesity grade 2 based on current BMI.  Estimated Nutritional Needs: Kcal: 25-30 kcal/kg Protein: > 1 gram protein/kg Fluid: 1 ml/kcal  Diet Order:   Pt is also offered choice of unit snacks mid-morning and mid-afternoon.  Pt is eating as desired.   Lab results and medications reviewed.   Oran Rein, RD, LDN Clinical Inpatient Dietitian Pager:  [phone] Weekend and after hours pager:  [phone]

## 2013-04-29 NOTE — Progress Notes (Signed)
Pt. Reports a good day.  Pt. States that he has been a substance abuse counselor in the past and is ready to start going to meetings again.  Pt. Is not interested in outpatient.  Pt. Does live with a sister that is very supportive and does not abuse alcohol or drugs.  Pt. Is a non insulin dependent diabetic.  Writer ckd pt.'s labs which showed an A!C of 10.0 and a WBC of 13.3.  Writer received an order for a BID CBG.  Pt.'s CBg this pm was 265.  Pt. Reports he has been depressed since 1990.  Parents passed when he was 73yrs. Old and his older sister raised him.  Pt.'s COWS have been a 0.

## 2013-04-29 NOTE — Progress Notes (Signed)
[facility] MD Progress Note  04/29/2013 4:35 PM Victor Lawrence  MRN:  [id number] Subjective:  States he feels more optimistic, more clear headed. He would like to be considered for another antidepressant that could help the anxiety better than the Prozac. (asked to be in the $4 list and have less sexual side effects) He is looking at [facility] but was taken back by the amount of documents they require. His sister has told him he can stay with her. He states that he thinks that as long as he is able to stay with her and go to AA/NA he could be fine not going to rehab. He is still endorsing some underlying anxiety Diagnosis:   DSM5: Schizophrenia Disorders:   Obsessive-Compulsive Disorders:   Trauma-Stressor Disorders:   Substance/Addictive Disorders:  Alcohol Related Disorder - Severe (303.90) and Cannabis Use Disorder - Severe (304.30) Depressive Disorders:  Major Depressive Disorder - Severe (296.23)    ADL's:  Intact  Sleep: Fair  Appetite:  Fair  Suicidal Ideation:  Plan:  denies Intent:  denies Means:  denies Homicidal Ideation:  Plan:  denies Intent:  denies Means:  denies AEB (as evidenced by):  Psychiatric Specialty Exam: Review of Systems  Constitutional: Negative.   HENT: Negative.   Eyes: Negative.   Respiratory: Negative.   Cardiovascular: Negative.   Gastrointestinal: Negative.   Genitourinary: Negative.   Musculoskeletal: Negative.   Skin: Negative.   Neurological: Negative.   Endo/Heme/Allergies: Negative.   Psychiatric/Behavioral: Positive for depression and substance abuse. The patient is nervous/anxious.     Blood pressure 144/84, pulse 70, temperature 97.6 F (36.4 C), temperature source Oral, resp. rate 16, height 5\' 10"  (1.778 m), weight 124.739 kg (275 lb), SpO2 96.00%.Body mass index is 39.46 kg/(m^2).  General Appearance: Fairly Groomed  Patent attorney::  Fair  Speech:  Clear and Coherent  Volume:  fluctuates  Mood:  Anxious, worried  Affect:   anxious, worried  Thought Process:  Coherent and Goal Directed  Orientation:  Full (Time, Place, and Person)  Thought Content:  worries, concerns, fear of relapsing  Suicidal Thoughts:  No  Homicidal Thoughts:  No  Memory:  Immediate;   Fair Recent;   Fair Remote;   Fair  Judgement:  Fair  Insight:  Present  Psychomotor Activity:  Restlessness  Concentration:  Fair  Recall:  Fair  Akathisia:  No  Handed:  Right  AIMS (if indicated):     Assets:  Desire for Improvement  Sleep:  Number of Hours: 6.25   Current Medications: Current Facility-Administered Medications  Medication Dose Route Frequency Provider Last Rate Last Dose  . busPIRone (BUSPAR) tablet 15 mg  15 mg Oral BID Rachael Fee, MD   15 mg at 04/29/13 7829  . chlordiazePOXIDE (LIBRIUM) capsule 25 mg  25 mg Oral TID PRN Nanine Means, NP      . citalopram (CELEXA) tablet 10 mg  10 mg Oral Daily Rachael Fee, MD   10 mg at 04/29/13 1404  . dicyclomine (BENTYL) tablet 20 mg  20 mg Oral Q6H PRN Nanine Means, NP      . hydrOXYzine (ATARAX/VISTARIL) tablet 25 mg  25 mg Oral Q6H PRN Nanine Means, NP   25 mg at 04/28/13 1431  . hydrOXYzine (ATARAX/VISTARIL) tablet 50 mg  50 mg Oral QHS PRN Rachael Fee, MD      . lisinopril (PRINIVIL,ZESTRIL) tablet 20 mg  20 mg Oral Daily Nanine Means, NP   20 mg at 04/29/13 0807  .  loperamide (IMODIUM) capsule 2-4 mg  2-4 mg Oral PRN Nanine Means, NP      . metFORMIN (GLUCOPHAGE-XR) 24 hr tablet 1,000 mg  1,000 mg Oral Q supper Nanine Means, NP   1,000 mg at 04/28/13 1711  . metFORMIN (GLUCOPHAGE-XR) 24 hr tablet 500 mg  500 mg Oral Q breakfast Sanjuana Kava, NP   500 mg at 04/29/13 1453  . mometasone-formoterol (DULERA) 100-5 MCG/ACT inhaler 2 puff  2 puff Inhalation BID Nanine Means, NP   2 puff at 04/29/13 0807  . naproxen (NAPROSYN) tablet 500 mg  500 mg Oral BID PRN Nanine Means, NP      . nicotine (NICODERM CQ - dosed in mg/24 hours) patch 14 mg  14 mg Transdermal Q0600 Sanjuana Kava, NP    14 mg at 04/29/13 0644  . ondansetron (ZOFRAN-ODT) disintegrating tablet 4 mg  4 mg Oral Q6H PRN Nanine Means, NP      . traZODone (DESYREL) tablet 50 mg  50 mg Oral QHS PRN Sanjuana Kava, NP   50 mg at 04/28/13 2233    Lab Results:  Results for orders placed during the hospital encounter of 04/26/13 (from the past 48 hour(s))  GLUCOSE, CAPILLARY     Status: Abnormal   Collection Time    04/28/13  9:17 PM      Result Value Range   Glucose-Capillary 265 (*) 70 - 99 mg/dL  GLUCOSE, CAPILLARY     Status: Abnormal   Collection Time    04/29/13  6:06 AM      Result Value Range   Glucose-Capillary 234 (*) 70 - 99 mg/dL    Physical Findings: AIMS: Facial and Oral Movements Muscles of Facial Expression: None, normal Lips and Perioral Area: None, normal Jaw: None, normal Tongue: None, normal,Extremity Movements Upper (arms, wrists, hands, fingers): None, normal Lower (legs, knees, ankles, toes): None, normal, Trunk Movements Neck, shoulders, hips: None, normal, Overall Severity Severity of abnormal movements (highest score from questions above): None, normal Incapacitation due to abnormal movements: None, normal Patient's awareness of abnormal movements (rate only patient's report): No Awareness, Dental Status Current problems with teeth and/or dentures?: No Does patient usually wear dentures?: No  CIWA:  CIWA-Ar Total: 0 COWS:  COWS Total Score: 0  Treatment Plan Summary: Daily contact with patient to assess and evaluate symptoms and progress in treatment Medication management  Plan: Supportive approach/coping skills/relapse prevention           Complete the Librium Detox           D/C Prozac, trial with Celexa           Looking back states he feels the Prozac quit working for him when he took it in the past  Medical Decision Making Problem Points:  Review of psycho-social stressors (1) Data Points:  Review of medication regiment & side effects (2) Review of new medications  or change in dosage (2)  I certify that inpatient services furnished can reasonably be expected to improve the patient's condition.   Abrielle Finck A 04/29/2013, 4:35 PM

## 2013-04-29 NOTE — Progress Notes (Signed)
Pt at the window for his 8pm med.  Pt reports he is doing well.  He denies any withdrawal symptoms at this time.  He states he may be discharged on Wednesday to stay with his sister.  He says he will attend AA groups.  He denies SI/HI/AV.  He voices no needs or complaints at this time.  Pt makes his needs known to staff.  Support and encouragement offered.  Safety maintained with q15 minute checks.

## 2013-04-29 NOTE — [facility] Suicide Risk Assessment (Signed)
[facility] Adult Inpatient Family/Significant Other Suicide Prevention Education  Suicide Prevention Education:   Patient Refusal for Family/Significant Other Suicide Prevention Education: The patient has refused to provide written consent for family/significant other to be provided Family/Significant Other Suicide Prevention Education during admission and/or prior to discharge.  Physician notified.  CSW provided suicide prevention information with patient.    The suicide prevention education provided includes the following:  Suicide risk factors  Suicide prevention and interventions  National Suicide Hotline telephone number  [facility] assessment telephone number  [facility] Emergency Assistance 911  [facility] and/or Residential Mobile Crisis Unit telephone number   Reyes Ivan, [place] 04/29/2013 9:53 AM

## 2013-04-30 LAB — GLUCOSE, CAPILLARY: Glucose-Capillary: 201 mg/dL — ABNORMAL HIGH (ref 70–99)

## 2013-04-30 MED ORDER — CITALOPRAM HYDROBROMIDE 10 MG PO TABS: 10.0000 mg | ORAL_TABLET | Freq: Every day | ORAL | Status: DC

## 2013-04-30 MED ORDER — METFORMIN HCL ER (OSM) 1000 MG PO TB24: ORAL_TABLET | ORAL | Status: DC

## 2013-04-30 MED ORDER — BUSPIRONE HCL 15 MG PO TABS: 15.0000 mg | ORAL_TABLET | Freq: Two times a day (BID) | ORAL | Status: DC

## 2013-04-30 MED ORDER — LISINOPRIL 20 MG PO TABS: 20.0000 mg | ORAL_TABLET | Freq: Every day | ORAL | Status: DC

## 2013-04-30 MED ORDER — TRAZODONE HCL 50 MG PO TABS: 50.0000 mg | ORAL_TABLET | Freq: Every evening | ORAL | Status: DC | PRN

## 2013-04-30 MED ORDER — HYDROXYZINE HCL 50 MG PO TABS: 50.0000 mg | ORAL_TABLET | Freq: Every evening | ORAL | Status: DC | PRN

## 2013-04-30 MED ORDER — MOMETASONE FURO-FORMOTEROL FUM 100-5 MCG/ACT IN AERO: 2.0000 | INHALATION_SPRAY | Freq: Two times a day (BID) | RESPIRATORY_TRACT | Status: DC

## 2013-04-30 MED ORDER — METFORMIN HCL ER (OSM) 1000 MG PO TB24: 1000.0000 mg | ORAL_TABLET | Freq: Every day | ORAL | Status: DC

## 2013-04-30 NOTE — BHH Group Notes (Signed)
[facility] LCSW Aftercare Discharge Planning Group Note   04/30/2013 8:45 AM  Participation Quality:  Alert and Appropriate   Mood/Affect:  Appropriate  Depression Rating:  1-2  Anxiety Rating:  1  Thoughts of Suicide:  Pt denies SI/HI  Will you contract for safety?   Yes  Current AVH:  Pt denies  Plan for Discharge/Comments:  Pt attended discharge planning group and actively participated in group.  CSW provided pt with today's workbook.  Pt states that he feels ready to d/c today. Pt states that he lives in [place] and will return home.  Pt states that he plans to follow up at [facility] in [facility], go to meetings and utilize his support system.  Pt states that he is still looking at [facility] for further treatment but will work on this from home.  No further needs voiced by pt at this time.    Transportation Means: Pt reports access to transportation  Supports: No supports mentioned at this time  Victor Lawrence, LCSWA 04/30/2013 9:41 AM

## 2013-04-30 NOTE — Progress Notes (Signed)
Chaplain provided brief support with pt on 300 hall.  Pt awaiting discharge.   Spoke with chaplain about history with AA and identified shame and guilt as having prevented him from rejoining AA in the past.  Reported moving states - from [place] to [facility] and [place] to [place] after relapses.   Chaplain affirmed pt's awareness of these feelings and barriers and pt and chaplain spoke about how his being able to speak about guilt can be helpful for him and others in AA who may be struggling with same.   Pt spoke with chaplain about church communities, reported he had attended a [employer], but found he did not fit due to church's stance on homosexuality.  Spoke with chaplain about value of his desire for inclusivity and his draw to be in community - even if this community was not the right one for him.   Pt grew up in [place] and feels drawn to return to catholicism - describing it as "home."  Pt and chaplain got contact information for [place] community in [place] and pt checked schedule for silent prayer retreats at [facility] and 12 step meetings at [facility].
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
## 2013-04-30 NOTE — Tx Team (Signed)
Interdisciplinary Treatment Plan Update (Adult)  Date: 04/30/2013  Time Reviewed:  9:45 AM  Progress in Treatment: Attending groups: Yes Participating in groups:  Yes Taking medication as prescribed:  Yes Tolerating medication:  Yes Family/Significant othe contact made: No, pt refused Patient understands diagnosis:  Yes Discussing patient identified problems/goals with staff:  Yes Medical problems stabilized or resolved:  Yes Denies suicidal/homicidal ideation: Yes Issues/concerns per patient self-inventory:  Yes Other:  New problem(s) identified: N/A  Discharge Plan or Barriers: Pt will follow up at [facility] in [facility] for medication management and therapy.    Reason for Continuation of Hospitalization: Stable to d/c  Comments: N/A  Estimated length of stay: D/C today  For review of initial/current patient goals, please see plan of care.  Attendees: Patient:     Family:     Physician:  Dr. Dub Mikes 04/30/2013 10:31 AM   Nursing:   Manuela Schwartz, RN 04/30/2013 10:31 AM   Clinical Social Worker:  Reyes Ivan, LCSWA 04/30/2013 10:31 AM   Other: Burnetta Sabin, RN 04/30/2013 10:31 AM   Other:  Trula Slade, LCSWA 04/30/2013 10:31 AM   Other:  Serena Colonel, NP 04/30/2013 10:32 AM   Other:  Roswell Miners, RN 04/30/2013 10:32 AM   Other: Onnie Boer, RN care manager 04/30/2013 10:32 AM   Other:    Other:    Other:    Other:    Other:     Scribe for Treatment Team:   Carmina Miller, 04/30/2013 , 10:31 AM

## 2013-04-30 NOTE — BHH Suicide Risk Assessment (Signed)
Suicide Risk Assessment  Discharge Assessment     Demographic Factors:  Male and Caucasian  Mental Status Per Nursing Assessment::   On Admission:  Suicidal ideation indicated by patient  Current Mental Status by Physician: In full contact with reality. There are no suicidal ideas, plans or intent. He has tolerated the Celexa will. 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 Will stay with his sister go to meetings and work towards getting into [facility]  Loss Factors: Decrease in vocational status and NA  Historical Factors: NA  Risk Reduction Factors:   Living with another person, especially a relative and Positive social support  Continued Clinical Symptoms:  Depression:   Comorbid alcohol abuse/dependence Alcohol/Substance Abuse/Dependencies  Cognitive Features That Contribute To Risk:  Polarized thinking Thought constriction (tunnel vision)    Suicide Risk:  Minimal: No identifiable suicidal ideation.  Patients presenting with no risk factors but with morbid ruminations; may be classified as minimal risk based on the severity of the depressive symptoms  Discharge Diagnoses:   AXIS I:  Polysubstance Dependence/Major Depression recurrent AXIS II:  Deferred AXIS III:   Past Medical History  Diagnosis Date  . Hypertension   . Diabetes mellitus without complication   . Arthritis    AXIS IV:  occupational problems and other psychosocial or environmental problems AXIS V:  61-70 mild symptoms  Plan Of Care/Follow-up recommendations:  Activity:  as tolerated Diet:  regular  Follow up outpatient basis; [facility] Is patient on multiple antipsychotic therapies at discharge:  No   Has Patient had three or more failed trials of antipsychotic monotherapy by history:  No  Recommended Plan for Multiple Antipsychotic Therapies: NA  Brydan Downard A 04/30/2013, 12:35  PM

## 2013-04-30 NOTE — Progress Notes (Signed)
Patient ID: Victor Lawrence, male   DOB: 08-01-1961, 52 y.o.   MRN: [id number] He has been discharged home to his sisters home. He drove self home. He voiced understanding of discharge instruction and of his follow up teaching about his medication and follow up. He denies thoughts of SI and all his belongings were taken home with him.

## 2013-04-30 NOTE — Progress Notes (Signed)
[facility] Ed Adult Case Management Discharge Plan :  Will you be returning to the same living situation after discharge: Yes,  returning home At discharge, do you have transportation home?:Yes,  access to transportation Do you have the ability to pay for your medications:Yes,  access to meds  Release of information consent forms completed and in the chart;  Patient's signature needed at discharge.  Patient to Follow up at: Follow-up Information   Follow up with [facility] On 05/02/2013. (Appointment scheduled at 8:00 am for hospital discharge appointment.  Referral # [id number])    Contact information:   [address] [facility] [place], [place] [address] Phone: [phone] Fax: [phone]      Patient denies SI/HI:   Yes,  denies Si/HI    Safety Planning and Suicide Prevention discussed:  Yes,  discussed with pt, pt refused contact with family/friends.  See suicide prevention education note.   Carmina Miller 04/30/2013, 12:29 PM

## 2013-05-02 NOTE — Progress Notes (Signed)
Patient Discharge Instructions:  After Visit Summary (AVS):   Faxed to:  05/02/13 Discharge Summary Note:   Faxed to:  05/02/13 Psychiatric Admission Assessment Note:   Faxed to:  05/02/13 Suicide Risk Assessment - Discharge Assessment:   Faxed to:  05/02/13 Faxed/Sent to the Next Level Care provider:  05/02/13 Faxed to [facility] @ [phone]  Jerelene Redden, 05/02/2013, 4:11 PM

## 2013-12-22 ENCOUNTER — Emergency Department (HOSPITAL_COMMUNITY): Payer: Self-pay

## 2013-12-22 ENCOUNTER — Inpatient Hospital Stay (HOSPITAL_COMMUNITY)
Admission: EM | Admit: 2013-12-22 | Discharge: 2013-12-26 | DRG: 885 | Disposition: A | Discharge status: Home / Self Care | Payer: 59 | Source: Intra-hospital | Attending: Psychiatry | Admitting: Psychiatry

## 2013-12-22 ENCOUNTER — Emergency Department (HOSPITAL_COMMUNITY)
Admission: EM | Admit: 2013-12-22 | Discharge: 2013-12-22 | Disposition: A | Discharge status: Other Acute Inpatient Hospital | Payer: 59 | Attending: Emergency Medicine | Admitting: Emergency Medicine

## 2013-12-22 ENCOUNTER — Encounter (HOSPITAL_COMMUNITY): Payer: Self-pay | Admitting: *Deleted

## 2013-12-22 ENCOUNTER — Encounter (HOSPITAL_COMMUNITY): Payer: Self-pay | Admitting: Emergency Medicine

## 2013-12-22 DIAGNOSIS — F39 Unspecified mood [affective] disorder: Secondary | ICD-10-CM

## 2013-12-22 DIAGNOSIS — Z91199 Patient's noncompliance with other medical treatment and regimen due to unspecified reason: Secondary | ICD-10-CM | POA: Insufficient documentation

## 2013-12-22 DIAGNOSIS — IMO0002 Reserved for concepts with insufficient information to code with codable children: Secondary | ICD-10-CM | POA: Insufficient documentation

## 2013-12-22 DIAGNOSIS — M129 Arthropathy, unspecified: Secondary | ICD-10-CM | POA: Diagnosis present

## 2013-12-22 DIAGNOSIS — Z87442 Personal history of urinary calculi: Secondary | ICD-10-CM | POA: Insufficient documentation

## 2013-12-22 DIAGNOSIS — Z8719 Personal history of other diseases of the digestive system: Secondary | ICD-10-CM | POA: Insufficient documentation

## 2013-12-22 DIAGNOSIS — G4700 Insomnia, unspecified: Secondary | ICD-10-CM | POA: Diagnosis present

## 2013-12-22 DIAGNOSIS — F332 Major depressive disorder, recurrent severe without psychotic features: Secondary | ICD-10-CM | POA: Insufficient documentation

## 2013-12-22 DIAGNOSIS — F1010 Alcohol abuse, uncomplicated: Secondary | ICD-10-CM | POA: Diagnosis present

## 2013-12-22 DIAGNOSIS — E119 Type 2 diabetes mellitus without complications: Secondary | ICD-10-CM | POA: Insufficient documentation

## 2013-12-22 DIAGNOSIS — F1210 Cannabis abuse, uncomplicated: Secondary | ICD-10-CM | POA: Diagnosis present

## 2013-12-22 DIAGNOSIS — R45851 Suicidal ideations: Secondary | ICD-10-CM

## 2013-12-22 DIAGNOSIS — F329 Major depressive disorder, single episode, unspecified: Secondary | ICD-10-CM

## 2013-12-22 DIAGNOSIS — F17200 Nicotine dependence, unspecified, uncomplicated: Secondary | ICD-10-CM | POA: Diagnosis present

## 2013-12-22 DIAGNOSIS — Z9119 Patient's noncompliance with other medical treatment and regimen: Secondary | ICD-10-CM | POA: Insufficient documentation

## 2013-12-22 DIAGNOSIS — F1920 Other psychoactive substance dependence, uncomplicated: Secondary | ICD-10-CM

## 2013-12-22 DIAGNOSIS — I10 Essential (primary) hypertension: Secondary | ICD-10-CM | POA: Insufficient documentation

## 2013-12-22 DIAGNOSIS — E785 Hyperlipidemia, unspecified: Secondary | ICD-10-CM | POA: Diagnosis present

## 2013-12-22 DIAGNOSIS — F411 Generalized anxiety disorder: Secondary | ICD-10-CM | POA: Diagnosis present

## 2013-12-22 DIAGNOSIS — F1220 Cannabis dependence, uncomplicated: Secondary | ICD-10-CM | POA: Insufficient documentation

## 2013-12-22 DIAGNOSIS — R630 Anorexia: Secondary | ICD-10-CM | POA: Insufficient documentation

## 2013-12-22 DIAGNOSIS — K219 Gastro-esophageal reflux disease without esophagitis: Secondary | ICD-10-CM | POA: Diagnosis present

## 2013-12-22 DIAGNOSIS — G4710 Hypersomnia, unspecified: Secondary | ICD-10-CM | POA: Insufficient documentation

## 2013-12-22 HISTORY — DX: Shortness of breath: R06.02

## 2013-12-22 HISTORY — DX: Depression, unspecified: F32.A

## 2013-12-22 HISTORY — DX: Hyperlipidemia, unspecified: E78.5

## 2013-12-22 HISTORY — DX: Reserved for inherently not codable concepts without codable children: IMO0001

## 2013-12-22 HISTORY — DX: Major depressive disorder, single episode, unspecified: F32.9

## 2013-12-22 HISTORY — DX: Gastro-esophageal reflux disease without esophagitis: K21.9

## 2013-12-22 HISTORY — DX: Calculus of kidney: N20.0

## 2013-12-22 LAB — COMPREHENSIVE METABOLIC PANEL
ALK PHOS: 68 U/L (ref 39–117)
ALT: 22 U/L (ref 0–53)
AST: 14 U/L (ref 0–37)
Albumin: 3.9 g/dL (ref 3.5–5.2)
BILIRUBIN TOTAL: 0.5 mg/dL (ref 0.3–1.2)
BUN: 10 mg/dL (ref 6–23)
CHLORIDE: 97 meq/L (ref 96–112)
CO2: 24 meq/L (ref 19–32)
Calcium: 9.1 mg/dL (ref 8.4–10.5)
Creatinine, Ser: 0.52 mg/dL (ref 0.50–1.35)
GFR calc non Af Amer: >90 mL/min (ref >90)
GLUCOSE: 292 mg/dL — AB (ref 70–99)
POTASSIUM: 4.6 meq/L (ref 3.7–5.3)
SODIUM: 137 meq/L (ref 137–147)
TOTAL PROTEIN: 7.1 g/dL (ref 6.0–8.3)

## 2013-12-22 LAB — RAPID URINE DRUG SCREEN, HOSP PERFORMED
Amphetamines: NOT DETECTED
BARBITURATES: NOT DETECTED
Benzodiazepines: NOT DETECTED
COCAINE: NOT DETECTED
Opiates: NOT DETECTED
TETRAHYDROCANNABINOL: POSITIVE — AB

## 2013-12-22 LAB — ETHANOL: Alcohol, Ethyl (B): <11 mg/dL (ref 0–11)

## 2013-12-22 LAB — CBC
HCT: 43 % (ref 39.0–52.0)
HEMOGLOBIN: 14.5 g/dL (ref 13.0–17.0)
MCH: 29.6 pg (ref 26.0–34.0)
MCHC: 33.7 g/dL (ref 30.0–36.0)
MCV: 87.8 fL (ref 78.0–100.0)
Platelets: 222 10*3/uL (ref 150–400)
RBC: 4.9 MIL/uL (ref 4.22–5.81)
RDW: 13.7 % (ref 11.5–15.5)
WBC: 9.3 10*3/uL (ref 4.0–10.5)

## 2013-12-22 LAB — CBG MONITORING, ED: GLUCOSE-CAPILLARY: 287 mg/dL — AB (ref 70–99)

## 2013-12-22 LAB — GLUCOSE, CAPILLARY: Glucose-Capillary: 353 mg/dL — ABNORMAL HIGH (ref 70–99)

## 2013-12-22 LAB — SALICYLATE LEVEL: Salicylate Lvl: <2 mg/dL — ABNORMAL LOW (ref 2.8–20.0)

## 2013-12-22 LAB — ACETAMINOPHEN LEVEL

## 2013-12-22 MED ORDER — METFORMIN HCL 500 MG PO TABS
500.0000 mg | ORAL_TABLET | Freq: Every day | ORAL | Status: DC
  Administered 2013-12-23 – 2013-12-26 (×4): 500 mg via ORAL
  Filled 2013-12-22 (×6): qty 1

## 2013-12-22 MED ORDER — IBUPROFEN 200 MG PO TABS: 600.0000 mg | ORAL_TABLET | Freq: Three times a day (TID) | ORAL | Status: DC | PRN

## 2013-12-22 MED ORDER — INSULIN ASPART 100 UNIT/ML ~~LOC~~ SOLN: 0.0000 [IU] | Freq: Three times a day (TID) | SUBCUTANEOUS | Status: DC

## 2013-12-22 MED ORDER — VITAMIN B-1 100 MG PO TABS
100.0000 mg | ORAL_TABLET | Freq: Every day | ORAL | Status: DC
  Administered 2013-12-22: 100 mg via ORAL
  Filled 2013-12-22: qty 1

## 2013-12-22 MED ORDER — BUSPIRONE HCL 15 MG PO TABS
15.0000 mg | ORAL_TABLET | Freq: Two times a day (BID) | ORAL | Status: DC
  Filled 2013-12-22 (×4): qty 1

## 2013-12-22 MED ORDER — NICOTINE 21 MG/24HR TD PT24
21.0000 mg | MEDICATED_PATCH | Freq: Every day | TRANSDERMAL | Status: DC
  Administered 2013-12-22: 21 mg via TRANSDERMAL
  Filled 2013-12-22: qty 1

## 2013-12-22 MED ORDER — MAGNESIUM HYDROXIDE 400 MG/5ML PO SUSP: 30.0000 mL | Freq: Every day | ORAL | Status: DC | PRN

## 2013-12-22 MED ORDER — HYDROXYZINE HCL 50 MG PO TABS: 50.0000 mg | ORAL_TABLET | Freq: Every evening | ORAL | Status: DC | PRN

## 2013-12-22 MED ORDER — TRAZODONE HCL 50 MG PO TABS
50.0000 mg | ORAL_TABLET | Freq: Every evening | ORAL | Status: DC | PRN
  Administered 2013-12-22 – 2013-12-23 (×2): 50 mg via ORAL
  Filled 2013-12-22 (×2): qty 1

## 2013-12-22 MED ORDER — LISINOPRIL 20 MG PO TABS
20.0000 mg | ORAL_TABLET | Freq: Every day | ORAL | Status: DC
  Administered 2013-12-23 – 2013-12-26 (×4): 20 mg via ORAL
  Filled 2013-12-22 (×6): qty 1

## 2013-12-22 MED ORDER — MOMETASONE FURO-FORMOTEROL FUM 100-5 MCG/ACT IN AERO
2.0000 | INHALATION_SPRAY | Freq: Two times a day (BID) | RESPIRATORY_TRACT | Status: DC
  Administered 2013-12-22 – 2013-12-26 (×8): 2 via RESPIRATORY_TRACT
  Filled 2013-12-22 (×2): qty 8.8

## 2013-12-22 MED ORDER — LORAZEPAM 1 MG PO TABS: 0.0000 mg | ORAL_TABLET | Freq: Four times a day (QID) | ORAL | Status: DC

## 2013-12-22 MED ORDER — THIAMINE HCL 100 MG/ML IJ SOLN: 100.0000 mg | Freq: Every day | INTRAMUSCULAR | Status: DC

## 2013-12-22 MED ORDER — IBUPROFEN 200 MG PO TABS: 400.0000 mg | ORAL_TABLET | Freq: Four times a day (QID) | ORAL | Status: DC | PRN

## 2013-12-22 MED ORDER — ACETAMINOPHEN 325 MG PO TABS: 650.0000 mg | ORAL_TABLET | Freq: Four times a day (QID) | ORAL | Status: DC | PRN

## 2013-12-22 MED ORDER — ALUM & MAG HYDROXIDE-SIMETH 200-200-20 MG/5ML PO SUSP: 30.0000 mL | ORAL | Status: DC | PRN

## 2013-12-22 MED ORDER — LORAZEPAM 1 MG PO TABS: 1.0000 mg | ORAL_TABLET | Freq: Three times a day (TID) | ORAL | Status: DC | PRN

## 2013-12-22 MED ORDER — LORAZEPAM 1 MG PO TABS: 0.0000 mg | ORAL_TABLET | Freq: Two times a day (BID) | ORAL | Status: DC

## 2013-12-22 MED ORDER — CITALOPRAM HYDROBROMIDE 10 MG PO TABS
10.0000 mg | ORAL_TABLET | Freq: Every day | ORAL | Status: DC
  Filled 2013-12-22 (×2): qty 1

## 2013-12-22 MED ORDER — METFORMIN HCL 500 MG PO TABS
1000.0000 mg | ORAL_TABLET | Freq: Every day | ORAL | Status: DC
  Administered 2013-12-22 – 2013-12-25 (×4): 1000 mg via ORAL
  Filled 2013-12-22 (×7): qty 2

## 2013-12-22 MED ORDER — ACETAMINOPHEN 325 MG PO TABS
650.0000 mg | ORAL_TABLET | ORAL | Status: DC | PRN
Start: 2013-12-22 — End: 2013-12-22

## 2013-12-22 NOTE — ED Notes (Signed)
Pt states extremely depressed and wants to hurt self.  Doesn't feel well either.  Tired and having abdominal pain.  Pt states plan would be to get chord and hang himself.  Placed chord in sisters truck last week.

## 2013-12-22 NOTE — Progress Notes (Signed)
  CARE MANAGEMENT ED NOTE 12/22/2013  Patient:  Victor Lawrence,Victor Lawrence   Account Number:  [id number]  Date Initiated:  12/22/2013  Documentation initiated by:  Radford PaxFERRERO,Naydene Kamrowski  Subjective/Objective Assessment:   Patient presents to Ed with depression and SI.     Subjective/Objective Assessment Detail:   Patient with pmhx of HTN, DM, Arthritis, depression, reflux and kidney stones.     Action/Plan:   Action/Plan Detail:   Anticipated DC Date:       Status Recommendation to Physician:   Result of Recommendation:    Other ED Services  Consult Working Plan    DC Planning Services  Other  PCP issues    Choice offered to / List presented to:            Status of service:  Completed, signed off  ED Comments:   ED Comments Detail:  EDCM spoke to patient at bedside.  Patient confirms he does not have a pcp, no insurance and [place]. [facility] currently in [place]. EDCM provided patient with a printed list of free clinics in [place], contact information and services of [facility] [place], and contact information for DSS [place].  Also provided patient with phone number to inquire about the Affordable Care Act.  No further EDCM needs at this time.

## 2013-12-22 NOTE — ED Provider Notes (Signed)
CSN: [id number]     Arrival date & time 12/22/13  1538 History   First MD Initiated Contact with Patient 12/22/13 1658     Chief Complaint  Patient presents with  . Medical Clearance     (Consider location/radiation/quality/duration/timing/severity/associated sxs/prior Treatment) Patient is a 53 y.o. male presenting with mental health disorder.  Mental Health Problem Presenting symptoms: suicidal thoughts   Degree of incapacity (severity):  Severe Onset quality:  Gradual Duration: chronic, worse over past week. Timing:  Constant Progression:  Worsening Chronicity:  Recurrent Context: alcohol use, drug abuse, noncompliance and stressful life event   Treatment compliance:  Untreated Relieved by:  Nothing Worsened by:  Nothing tried Associated symptoms: anhedonia, appetite change, feelings of worthlessness and hypersomnia   Associated symptoms: no abdominal pain and no chest pain     Past Medical History  Diagnosis Date  . Hypertension   . Diabetes mellitus without complication   . Arthritis   . Depression   . Kidney stones   . Reflux    Past Surgical History  Procedure Laterality Date  . Cholecystectomy    . Tonsillectomy    . Cystoscopy     History reviewed. No pertinent family history. History  Substance Use Topics  . Smoking status: Current Every Day Smoker -- 2.00 packs/day for 40 years    Types: Cigarettes  . Smokeless tobacco: Not on file  . Alcohol Use: 0.6 oz/week    1 Cans of beer per week    Review of Systems  Constitutional: Positive for appetite change. Negative for fever.  HENT: Negative for congestion.   Respiratory: Negative for cough and shortness of breath.   Cardiovascular: Negative for chest pain.  Gastrointestinal: Negative for nausea, vomiting, abdominal pain and diarrhea.  Psychiatric/Behavioral: Positive for suicidal ideas.  All other systems reviewed and are negative.     Allergies  Review of patient's allergies indicates no known  allergies.  Home Medications   Prior to Admission medications   Medication Sig Start Date End Date Taking? Authorizing Provider  ibuprofen (ADVIL,MOTRIN) 200 MG tablet Take 400-600 mg by mouth every 6 (six) hours as needed (tooth pain).   Yes Historical Provider, MD  metFORMIN (GLUCOPHAGE) 1000 MG tablet Take 500-1,000 mg by mouth 2 (two) times daily at 8 am and 10 pm. Take 500 mg (1/2 tablet) before breakfast and 1000 mg (1 tablet) at bedtime.   Yes Historical Provider, MD  busPIRone (BUSPAR) 15 MG tablet Take 15 mg by mouth 2 (two) times daily. For anxiety 04/30/13   Sanjuana KavaAgnes I Nwoko, NP  citalopram (CELEXA) 10 MG tablet Take 1 tablet (10 mg total) by mouth daily. For depression 04/30/13   Sanjuana KavaAgnes I Nwoko, NP  hydrOXYzine (ATARAX/VISTARIL) 50 MG tablet Take 1 tablet (50 mg total) by mouth at bedtime as needed for anxiety (sleep). 04/30/13   Sanjuana KavaAgnes I Nwoko, NP  lisinopril (PRINIVIL,ZESTRIL) 20 MG tablet Take 1 tablet (20 mg total) by mouth daily. For high blood pressure control 04/30/13   Sanjuana KavaAgnes I Nwoko, NP  mometasone-formoterol (DULERA) 100-5 MCG/ACT AERO Inhale 2 puffs into the lungs 2 (two) times daily. For asthma 04/30/13   Sanjuana KavaAgnes I Nwoko, NP  traZODone (DESYREL) 50 MG tablet Take 1 tablet (50 mg total) by mouth at bedtime as needed for sleep. For sleep 04/30/13   Sanjuana KavaAgnes I Nwoko, NP   BP 166/79  Pulse 85  Temp(Src) 100.1 F (37.8 C) (Oral)  Resp 20  Ht 5\' 11"  (1.803 m)  Wt 280 lb (  127.007 kg)  BMI 39.07 kg/m2  SpO2 96% Physical Exam  Nursing note and vitals reviewed. Constitutional: He is oriented to person, place, and time. He appears well-developed and well-nourished. No distress.  HENT:  Head: Normocephalic and atraumatic.  Mouth/Throat: Oropharynx is clear and moist.  Eyes: Conjunctivae are normal. Pupils are equal, round, and reactive to light. No scleral icterus.  Neck: Neck supple.  Cardiovascular: Normal rate, regular rhythm, normal heart sounds and intact distal pulses.   No murmur  heard. Pulmonary/Chest: Effort normal and breath sounds normal. No stridor. No respiratory distress. He has no wheezes. He has no rales.  Abdominal: Soft. He exhibits no distension. There is no tenderness.  Musculoskeletal: Normal range of motion. He exhibits no edema.  Neurological: He is alert and oriented to person, place, and time. He has normal strength. No cranial nerve deficit or sensory deficit. Coordination and gait normal. GCS eye subscore is 4. GCS verbal subscore is 5. GCS motor subscore is 6.  Skin: Skin is warm and dry. No rash noted.  Psychiatric: He has a normal mood and affect. His behavior is normal.    ED Course  Procedures (including critical care time) Labs Review Labs Reviewed  COMPREHENSIVE METABOLIC PANEL - Abnormal; Notable for the following:    Glucose, Bld 292 (*)    All other components within normal limits  SALICYLATE LEVEL - Abnormal; Notable for the following:    Salicylate Lvl <2.0 (*)    All other components within normal limits  URINE RAPID DRUG SCREEN (HOSP PERFORMED) - Abnormal; Notable for the following:    Tetrahydrocannabinol POSITIVE (*)    All other components within normal limits  CBG MONITORING, ED - Abnormal; Notable for the following:    Glucose-Capillary 287 (*)    All other components within normal limits  ACETAMINOPHEN LEVEL  CBC  ETHANOL    Imaging Review Dg Chest 2 View  12/22/2013   CLINICAL DATA:  Productive cough, history of hypertension.  EXAM: CHEST  2 VIEW  COMPARISON:  None.  FINDINGS: The lungs are clear and negative for focal airspace consolidation, pulmonary edema or suspicious pulmonary nodule. No pleural effusion or pneumothorax. Borderline cardiomegaly. Mediastinal contours are within normal limits. Trace atherosclerotic calcification in the transverse aorta. No acute fracture or lytic or blastic osseous lesions. The visualized upper abdominal bowel gas pattern is unremarkable.  IMPRESSION: No active cardiopulmonary  disease.  Borderline cardiomegaly.  Trace aortic atherosclerosis.   Electronically Signed   By: Malachy MoanHeath  McCullough M.D.   On: 12/22/2013 17:10  All radiology studies independently viewed by me.      EKG Interpretation None      MDM   Clinical Impression: Suicidal Ideation  53 yo male with hx of depression who presents with SI.  Has plan to hang self on a tree with extension cord or drive his sister's truck into a pole.  He carries around the extension cord.  Will consult TTS.  Also complains of some nonspecific symptoms including fatigue and intermittent light headedness.  Labwork shows elevated glucose (he's not been on his glycemic control meds for a few years).  Otherwise workup unremarkable.  Will place in psych hold.      Candyce ChurnJohn David Anissa Abbs III, MD 12/23/13 [phone]

## 2013-12-22 NOTE — Tx Team (Signed)
Initial Interdisciplinary Treatment Plan  PATIENT STRENGTHS: (choose at least two) Ability for insight Active sense of humor Average or above average intelligence Capable of independent living Communication skills General fund of knowledge Motivation for treatment/growth Physical Health Special hobby/interest Supportive family/friends Work skills  PATIENT STRESSORS: Financial difficulties Loss of divorce of wife apprx 3 yrs ago Marital or family conflict Medication change or noncompliance Substance abuse   PROBLEM LIST: Problem List/Patient Goals Date to be addressed Date deferred Reason deferred Estimated date of resolution  " I want to get my mental illness back on track and my physical back on track" 12/22/13                 Depression 12/22/13     Increased risk for suicide 12/22/13     Substance abuse 12/22/13                        DISCHARGE CRITERIA:  Ability to meet basic life and health needs Adequate post-discharge living arrangements Improved stabilization in mood, thinking, and/or behavior Medical problems require only outpatient monitoring Motivation to continue treatment in a less acute level of care Need for constant or close observation no longer present Reduction of life-threatening or endangering symptoms to within safe limits Safe-care adequate arrangements made Verbal commitment to aftercare and medication compliance Withdrawal symptoms are absent or subacute and managed without 24-hour nursing intervention  PRELIMINARY DISCHARGE PLAN: Attend 12-step recovery group Outpatient therapy Participate in family therapy Placement in alternative living arrangements  PATIENT/FAMIILY INVOLVEMENT: This treatment plan has been presented to and reviewed with the patient, Victor Lawrence, and/or family member.  The patient and family have been given the opportunity to ask questions and make suggestions.  Doristine JohnsKim Laverne Adetokunbo Mccadden 12/22/2013, 10:45 PM

## 2013-12-22 NOTE — BH Assessment (Addendum)
Assessment Note  Victor Lawrence is an 53 y.o. male. Patient drove self to the ED because thoughts of suicide with plan to hang self. Patient reports putting an extension cord in his truck and today driving around search for the best tree to hang self.  Patient reports current depression symptoms includes poor concentration, fatigue, dizziness, hopelessness, worthlessness, isolating, decreased concentration, decreased hygiene, only bathing 2-3x weekly, decreased oral hygiene, and increased obsession of pleasurable things.  Patient reports minor habits recently has increased such as smoking, eating, gambling, shopping, and smoking.  [facility], [facility] [place], and [facility] [place].  Patient denies HI, hallucinations, and other self injurious behaviors.  Patient reports drinking 3-4 beers 2-3x week and smoking THC 1-2x week. Patient denies current withdrawal symptoms.  CSW ran patient with Catha NottinghamJamison it is recommended for inpatient treatment.  CSW spoke with Julieanne Cottonina AC who accepted to bed 502-2.  CSW reviewed voluntary paperwork with patient and he agreed to treatment.  CSW provided the nursing staff with all documentation needed to complete this transfer.     Axis I: Major Depression, Recurrent severe Axis II: Deferred Axis III:  Past Medical History  Diagnosis Date  . Hypertension   . Diabetes mellitus without complication   . Arthritis   . Depression   . Kidney stones   . Reflux    Axis IV: economic problems, housing problems, occupational problems, other psychosocial or environmental problems, problems related to social environment, problems with access to health care services and problems with primary support group Axis V: 11-20 some danger of hurting self or others possible OR occasionally fails to maintain minimal personal hygiene OR gross impairment in communication  Past Medical History:  Past Medical History  Diagnosis Date  .  Hypertension   . Diabetes mellitus without complication   . Arthritis   . Depression   . Kidney stones   . Reflux     Past Surgical History  Procedure Laterality Date  . Cholecystectomy    . Tonsillectomy    . Cystoscopy      Family History: History reviewed. No pertinent family history.  Social History:  reports that he has been smoking Cigarettes.  He has a 80 pack-year smoking history. He does not have any smokeless tobacco history on file. He reports that he drinks about .6 ounces of alcohol per week. He reports that he uses illicit drugs (Marijuana and Oxycodone).  Additional Social History:     CIWA: CIWA-Ar BP: 152/89 mmHg Pulse Rate: 76 Nausea and Vomiting: no nausea and no vomiting Tactile Disturbances: none Tremor: no tremor Auditory Disturbances: not present Paroxysmal Sweats: no sweat visible Visual Disturbances: not present Anxiety: no anxiety, at ease Headache, Fullness in Head: none present Agitation: normal activity Orientation and Clouding of Sensorium: oriented and can do serial additions CIWA-Ar Total: 0 COWS:    Allergies: No Known Allergies  Home Medications:  (Not in a hospital admission)  OB/GYN Status:  No LMP for male patient.  General Assessment Data Location of Assessment: [facility] ACT Assessment: Yes Is this a Tele or Face-to-Face Assessment?: Face-to-Face Is this an Initial Assessment or a Re-assessment for this encounter?: Initial Assessment Living Arrangements: Other relatives Can pt return to current living arrangement?: Yes Admission Status: Voluntary Is patient capable of signing voluntary admission?: Yes Transfer from: Home Referral Source: Self/Family/Friend  Medical Screening Exam [facility] Walk-in ONLY) Medical Exam completed: Yes  [facility] Living Arrangements: Other relatives  Education Status Is patient currently in school?: No Highest grade of school patient has completed: some college  Risk to  self Suicidal Ideation: Yes-Currently Present Suicidal Intent: Yes-Currently Present Is patient at risk for suicide?: Yes Suicidal Plan?: Yes-Currently Present Specify Current Suicidal Plan: Has an extension cord in car to hang self Access to Means: Yes Specify Access to Suicidal Means: extension in car to find the right tree to hang self What has been your use of drugs/alcohol within the last 12 months?: couple times a week Previous Attempts/Gestures: Yes How many times?: 1 Triggers for Past Attempts: Other (Comment) (constantly feeling suicidal but recently has a plan) Intentional Self Injurious Behavior: None Family Suicide History: No Recent stressful life event(s): Financial Problems;Loss (Comment) Persecutory voices/beliefs?: No Depression: Yes Depression Symptoms: Despondent;Isolating;Fatigue;Loss of interest in usual pleasures;Feeling worthless/self pity (hopelessness) Substance abuse history and/or treatment for substance abuse?: Yes  Risk to Others Homicidal Ideation: No-Not Currently/Within Last 6 Months Thoughts of Harm to Others: No-Not Currently Present/Within Last 6 Months Current Homicidal Intent: No-Not Currently/Within Last 6 Months Current Homicidal Plan: No-Not Currently/Within Last 6 Months Access to Homicidal Means: No Identified Victim: none History of harm to others?: No Assessment of Violence: None Noted Does patient have access to weapons?: No Criminal Charges Pending?: No Does patient have a court date: No  Psychosis Hallucinations: None noted Delusions: None noted  Mental Status Report Appear/Hygiene: Other (Comment) (appropriate in ED attire) Eye Contact: Good Motor Activity: Freedom of movement Speech: Logical/coherent Level of Consciousness: Alert Mood: Depressed Affect: Depressed Anxiety Level: Minimal Thought Processes: Coherent Judgement: Unimpaired Orientation: Person;Place;Time;Situation Obsessive Compulsive Thoughts/Behaviors:  None  Cognitive Functioning Concentration: Decreased Memory: Recent Intact;Remote Intact IQ: Average Insight: Fair Impulse Control: Fair Appetite: Fair Sleep: Decreased Total Hours of Sleep: 3 Vegetative Symptoms: Decreased grooming;Not bathing  ADLScreening [facility] Assessment Services) Patient's cognitive ability adequate to safely complete daily activities?: Yes Patient able to express need for assistance with ADLs?: Yes Independently performs ADLs?: Yes (appropriate for developmental age)  Prior Inpatient Therapy Prior Inpatient Therapy: Yes Prior Therapy Dates: unknown Prior Therapy [facility] [place] Reason for Treatment: MH/SA  Prior Outpatient Therapy Prior Outpatient Therapy: No  ADL Screening (condition at time of admission) Patient's cognitive ability adequate to safely complete daily activities?: Yes Patient able to express need for assistance with ADLs?: Yes Independently performs ADLs?: Yes (appropriate for developmental age)         Values / Beliefs Cultural Requests During Hospitalization: None Spiritual Requests During Hospitalization: None        Additional Information 1:1 In Past 12 Months?: No CIRT Risk: No Elopement Risk: No Does patient have medical clearance?: Yes     Disposition:  Disposition Initial Assessment Completed for this Encounter: Yes Disposition of Patient: Inpatient treatment program Type of inpatient treatment program: Adult  On Site Evaluation by:   Reviewed with Physician:    Phoebe Perchressa A Itzel Lowrimore 12/22/2013 7:19 PM

## 2013-12-22 NOTE — ED Notes (Signed)
Pt also states cough/congestion, dizziness and shortness of breath.  Pt with low grade fever in triage.

## 2013-12-22 NOTE — Progress Notes (Signed)
Psychoeducational Group Note  Date:  12/22/2013 Time:  8:00 p.m.   Group Topic/Focus:  Wrap-Up Group:   The focus of this group is to help patients review their daily goal of treatment and discuss progress on daily workbooks.  Participation Level: Did Not Attend  Participation Quality:  Not Applicable  Affect:  Not Applicable  Cognitive:  Not Applicable  Insight:  Not Applicable  Engagement in Group: Not Applicable  Additional Comments:  The patient didn't attend group since he had to be admitted to the unit.   Westly PamBenjamin S Yajayra Feldt 12/22/2013, 9:47 PM

## 2013-12-22 NOTE — ED Notes (Signed)
Patient discharge via ambulatory with steady gait.

## 2013-12-22 NOTE — ED Notes (Signed)
[employer]

## 2013-12-22 NOTE — Progress Notes (Signed)
I have reviewed the TTS assessment and concur with the plan to admit this patient to 500 hall for stabilization.

## 2013-12-23 ENCOUNTER — Other Ambulatory Visit: Payer: Self-pay

## 2013-12-23 DIAGNOSIS — F332 Major depressive disorder, recurrent severe without psychotic features: Principal | ICD-10-CM

## 2013-12-23 DIAGNOSIS — F1210 Cannabis abuse, uncomplicated: Secondary | ICD-10-CM

## 2013-12-23 DIAGNOSIS — F1010 Alcohol abuse, uncomplicated: Secondary | ICD-10-CM

## 2013-12-23 LAB — GLUCOSE, CAPILLARY
Glucose-Capillary: 202 mg/dL — ABNORMAL HIGH (ref 70–99)
Glucose-Capillary: 240 mg/dL — ABNORMAL HIGH (ref 70–99)
Glucose-Capillary: 278 mg/dL — ABNORMAL HIGH (ref 70–99)
Glucose-Capillary: 207 mg/dL — ABNORMAL HIGH (ref 70–99)

## 2013-12-23 MED ORDER — INSULIN ASPART 100 UNIT/ML ~~LOC~~ SOLN
0.0000 [IU] | Freq: Three times a day (TID) | SUBCUTANEOUS | Status: DC
  Administered 2013-12-23: 11 [IU] via SUBCUTANEOUS
  Administered 2013-12-23: 7 [IU] via SUBCUTANEOUS
  Administered 2013-12-24 (×2): 4 [IU] via SUBCUTANEOUS
  Administered 2013-12-24: 7 [IU] via SUBCUTANEOUS
  Administered 2013-12-25: 4 [IU] via SUBCUTANEOUS
  Administered 2013-12-25 – 2013-12-26 (×3): 7 [IU] via SUBCUTANEOUS
  Administered 2013-12-26: 4 [IU] via SUBCUTANEOUS

## 2013-12-23 MED ORDER — INSULIN ASPART 100 UNIT/ML ~~LOC~~ SOLN
0.0000 [IU] | Freq: Every day | SUBCUTANEOUS | Status: DC
Start: 2013-12-23 — End: 2013-12-26
  Administered 2013-12-23: 2 [IU] via SUBCUTANEOUS
  Administered 2013-12-25: 4 [IU] via SUBCUTANEOUS

## 2013-12-23 MED ORDER — FLUOXETINE HCL 20 MG PO CAPS
20.0000 mg | ORAL_CAPSULE | Freq: Every day | ORAL | Status: DC
  Administered 2013-12-23 – 2013-12-26 (×4): 20 mg via ORAL
  Filled 2013-12-23 (×7): qty 1

## 2013-12-23 NOTE — Progress Notes (Signed)
The focus of this group is to educate the patient on the purpose and policies of crisis stabilization and provide a format to answer questions about their admission.  The group details unit policies and expectations of patients while admitted.  Patient attended 0900 nurse education orientation group this morning.  Patient actively participated, appropriate affect, alert, appropriate insight and engagement.  Today patient will work on 3 goals for discharge.  

## 2013-12-23 NOTE — Progress Notes (Signed)
Recreation Therapy Notes  Animal-Assisted Activity/Therapy (AAA/T) Program Checklist/Progress Notes Patient Eligibility Criteria Checklist & Daily Group note for Rec Tx Intervention  Date: 04.21.2015 Time: 2:45pm Location: [address], applications[facility] Dayroom    AAA/T Program Assumption of Risk Form signed by Patient/ or Parent Legal Guardian yes  Patient is free of allergies or sever asthma yes  Patient reports no fear of animals yes  Patient reports no history of cruelty to animals yes   Patient understands his/her participation is voluntary yes  Patient washes hands before animal contact yes  Patient washes hands after animal contact yes  Behavioral Response: Appropriate  Education: Hand Washing, Appropriate Animal Interaction   Education Outcome: Acknowledges understanding  Clinical Observations/Feedback: Patient interacted appropriately with therapeutic dog team, peers and LRT.   Marykay Lexenise L Waylon Hershey, LRT/CTRS  Cristan Scherzer L Javoris Star 12/23/2013 4:22 PM

## 2013-12-23 NOTE — H&P (Signed)
Psychiatric Admission Assessment Adult  Patient Identification:  Victor Lawrence Date of Evaluation:  12/23/2013 Chief Complaint:  MDD History of Present Illness: Victor Lawrence is an 53 y.o. male. Patient drove self to the ED because thoughts of suicide with plan to hang self. Patient reports putting an extension cord in his truck and today driving around search for the best tree to hang self.    Patient has a long history of depression with multiple hospitalizations. He has a history of substance abuse with period of sobriety in which he earned and addiction Specialist Certification. Elements:  Location:  adult unit. Quality:  chronic. Severity:  severe. Timing:  worsening over the past 7-8 weeks. Duration:  years. Context:  jobless,relapsed, medication non-compliant. Associated Signs/Synptoms: Depression Symptoms:  depressed mood, anhedonia, insomnia, psychomotor retardation, fatigue, feelings of worthlessness/guilt, difficulty concentrating, hopelessness, suicidal thoughts with specific plan, anxiety, (Hypo) Manic Symptoms:  denies Anxiety Symptoms:  Excessive Worry, Panic Symptoms, Psychotic Symptoms:  denies PTSD Symptoms:denies Total Time spent with patient: 45 minutes  Psychiatric Specialty Exam: Physical Exam  Psychiatric: His mood appears anxious. His affect is angry and labile. He is agitated. Cognition and memory are impaired. He expresses impulsivity and inappropriate judgment. He exhibits a depressed mood. He expresses suicidal ideation. He expresses suicidal plans.  Patient is seen and chart is reviewed. I agree with the findings of the exam in the ED with no exceptions.    Review of Systems  Constitutional: Positive for malaise/fatigue.  Eyes: Negative.   Respiratory: Positive for cough, shortness of breath and wheezing.   Cardiovascular: Positive for chest pain and palpitations.  Gastrointestinal: Negative.   Genitourinary: Negative.   Musculoskeletal: Positive for  joint pain.  Skin: Negative.   Neurological: Positive for tingling, weakness and headaches.  Endo/Heme/Allergies: Negative.   Psychiatric/Behavioral: Positive for depression, suicidal ideas and substance abuse. The patient is nervous/anxious and has insomnia.     Blood pressure 147/88, pulse 76, temperature 97.8 F (36.6 C), temperature source Oral, resp. rate 16, height 5\' 9"  (1.753 m), weight 127.914 kg (282 lb).Body mass index is 41.63 kg/(m^2).  General Appearance: Casual  Eye Contact::  Good  Speech:  Clear and coherent, circumstantial  Volume:  Normal  Mood:  Anxious and Depressed  Affect:  Labile  Thought Process:  Circumstantial  Orientation:  Full (Time, Place, and Person)  Thought Content:  WDL  Suicidal Thoughts:  Yes.  with intent/plan  Homicidal Thoughts:  No  Memory:  Negative  Judgement:  Poor  Insight:  Shallow  Psychomotor Activity:  Decreased  Concentration:  Fair  Recall:  Fair  Fund of Knowledge:Good  Language: Good  Akathisia:  No  Handed:  Right  AIMS (if indicated):     Assets:  Communication Skills Desire for Improvement Housing Social Support  Sleep:  Number of Hours: 5    Musculoskeletal: Strength & Muscle Tone: within normal limits Gait & Station: normal Patient leans: N/A  Past Psychiatric History: Diagnosis:  Hospitalizations:  Outpatient Care:  Substance Abuse Care:  Self-Mutilation:  Suicidal Attempts:  Violent Behaviors:   Past Medical History:   Past Medical History  Diagnosis Date  . Hypertension   . Diabetes mellitus without complication   . Arthritis   . Depression   . Kidney stones   . Reflux   . Shortness of breath   . Hyperlipidemia    None. Allergies:  No Known Allergies PTA Medications: Prescriptions prior to admission  Medication Sig Dispense Refill  . ibuprofen (ADVIL,MOTRIN) 200  MG tablet Take 400-600 mg by mouth every 6 (six) hours as needed (tooth pain).      . [DISCONTINUED] mometasone-formoterol  (DULERA) 100-5 MCG/ACT AERO Inhale 2 puffs into the lungs 2 (two) times daily. For asthma        Previous Psychotropic Medications:  Medication/Dose                 Substance Abuse History in the last 12 months:  yes  Consequences of Substance Abuse: Medical Consequences:  worsening depression  Social History:  reports that he has been smoking Cigarettes.  He has a 60 pack-year smoking history. He does not have any smokeless tobacco history on file. He reports that he drinks about .6 ounces of alcohol per week. He reports that he uses illicit drugs (Marijuana). Additional Social History: Pain Medications: see mar Prescriptions: see mar Over the Counter: see mar History of alcohol / drug use?: Yes Longest period of sobriety (when/how long): 9 yrs fro 2001 to 2010 Negative Consequences of Use: Financial;Personal relationships;Work / Programmer, multimedia Name of Substance 1: etoh 1 - Age of First Use: 12 1 - Frequency: 3 to 4 times weekly for apprx 2 yrs 1 - Duration: on going 1 - Last Use / Amount: Sat 12/20/13 Name of Substance 2: THC 2 - Age of First Use: 12 2 - Amount (size/oz): varies 2 - Frequency: 1 to 2 times weekly 2 - Duration: on going 2 - Last Use / Amount: 12/19/13                Current Place of Residence:   Place of Birth:   Family Members: Marital Status:  Divorced Children:  Sons:  Daughters: Relationships: Education:  Corporate treasurer Problems/Performance: Religious Beliefs/Practices: History of Abuse (Emotional/Phsycial/Sexual) Occupational Experiences; Military History:  None. Legal History: Hobbies/Interests:  Family History:  History reviewed. No pertinent family history.  Results for orders placed during the hospital encounter of 12/22/13 (from the past 72 hour(s))  GLUCOSE, CAPILLARY     Status: Abnormal   Collection Time    12/22/13 11:08 PM      Result Value Ref Range   Glucose-Capillary 353 (*) 70 - 99 mg/dL   Comment 1 Notify RN     GLUCOSE, CAPILLARY     Status: Abnormal   Collection Time    12/23/13  6:22 AM      Result Value Ref Range   Glucose-Capillary 202 (*) 70 - 99 mg/dL   Comment 1 Notify RN     Psychological Evaluations:  Assessment:   DSM5:  Schizophrenia Disorders:   Obsessive-Compulsive Disorders:   Trauma-Stressor Disorders:   Substance/Addictive Disorders:  Alcohol Related Disorder - Moderate (303.90) Depressive Disorders:  Major Depressive Disorder - Severe (296.23)  AXIS I:  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 Hypertension   . Diabetes mellitus without complication   . Arthritis   . Depression   . Kidney stones   . Reflux   . Shortness of breath   . Hyperlipidemia    AXIS IV:  occupational problems, problems related to social environment, problems with access to health care services and problems with primary support group AXIS V:  41-50 serious symptoms  Treatment Plan/Recommendations:    Treatment Plan Summary: Daily contact with patient to assess and evaluate symptoms and progress in treatment Medication management Current Medications:  Current Facility-Administered Medications  Medication Dose Route  Frequency Provider Last Rate Last Dose  . acetaminophen (TYLENOL) tablet 650 mg  650 mg Oral Q6H PRN Kerry HoughSpencer E Simon, PA-C      . alum & mag hydroxide-simeth (MAALOX/MYLANTA) 200-200-20 MG/5ML suspension 30 mL  30 mL Oral Q4H PRN Kerry HoughSpencer E Simon, PA-C      . busPIRone (BUSPAR) tablet 15 mg  15 mg Oral BID Kerry HoughSpencer E Simon, PA-C      . citalopram (CELEXA) tablet 10 mg  10 mg Oral Daily Kerry HoughSpencer E Simon, PA-C      . hydrOXYzine (ATARAX/VISTARIL) tablet 50 mg  50 mg Oral QHS PRN Kerry HoughSpencer E Simon, PA-C      . ibuprofen (ADVIL,MOTRIN) tablet 400-600 mg  400-600 mg Oral Q6H PRN Kerry HoughSpencer E Simon, PA-C      . insulin aspart (novoLOG) injection 0-20 Units  0-20 Units Subcutaneous TID WC  Spencer E Simon, PA-C      . insulin aspart (novoLOG) injection 0-5 Units  0-5 Units Subcutaneous QHS Spencer E Simon, PA-C      . lisinopril (PRINIVIL,ZESTRIL) tablet 20 mg  20 mg Oral Daily Kerry HoughSpencer E Simon, PA-C   20 mg at 12/23/13 0809  . magnesium hydroxide (MILK OF MAGNESIA) suspension 30 mL  30 mL Oral Daily PRN Kerry HoughSpencer E Simon, PA-C      . metFORMIN (GLUCOPHAGE) tablet 1,000 mg  1,000 mg Oral QHS Kerry HoughSpencer E Simon, PA-C   1,000 mg at 12/22/13 2304  . metFORMIN (GLUCOPHAGE) tablet 500 mg  500 mg Oral Q breakfast Nehemiah SettleJanardhaha R Jonnalagadda, MD   500 mg at 12/23/13 0809  . mometasone-formoterol (DULERA) 100-5 MCG/ACT inhaler 2 puff  2 puff Inhalation BID Kerry HoughSpencer E Simon, PA-C   2 puff at 12/23/13 0810  . traZODone (DESYREL) tablet 50 mg  50 mg Oral QHS PRN Kerry HoughSpencer E Simon, PA-C   50 mg at 12/22/13 2304    Observation Level/Precautions:  Routine   Laboratory:  Hgb A1c  Psychotherapy:  groups  Medications:  As written, insulin, glucophage prozac  Consultations:  As needed  Discharge Concerns:  Jobless, access to care, increased risk for relapse, readmission, suicide  Estimated LOS:   5-7 days  Other:     I certify that inpatient services furnished can reasonably be expected to improve the patient's condition.   Verne Spurreil Mashburn 4/21/201510:44 AM Personally evaluated the patient, reviewed the physical exam and labs and agree with assessment and plan Madie RenoIrving A. Dub MikesLugo, MD.

## 2013-12-23 NOTE — [facility] Suicide Risk Assessment (Signed)
[facility] INPATIENT:  Family/Significant Other Suicide Prevention Education  Suicide Prevention Education:  Patient Refusal for Family/Significant Other Suicide Prevention Education: The patient Victor Lawrence has refused to provide written consent for family/significant other to be provided Family/Significant Other Suicide Prevention Education during admission and/or prior to discharge.  Physician notified.  Malek Skog Hairston Marlette Curvin 12/23/2013, 12:56 PM

## 2013-12-23 NOTE — [facility] Suicide Risk Assessment (Signed)
Suicide Risk Assessment  Admission Assessment     Nursing information obtained from:  Patient Demographic factors:  Male;Caucasian;Divorced or widowed;Low socioeconomic status;Unemployed Current Mental Status:  NA Loss Factors:  Financial problems / change in socioeconomic status;Decline in physical health;Loss of significant relationship Historical Factors:  Prior suicide attempts;Family history of mental illness or substance abuse;Anniversary of important loss;Impulsivity Risk Reduction Factors:  Sense of responsibility to family;Religious beliefs about death;Living with another person, especially a relative Total Time spent with patient: 45 minutes  CLINICAL FACTORS:   Depression:   Comorbid alcohol abuse/dependence Alcohol/Substance Abuse/Dependencies  Psychiatric Specialty Exam:     Blood pressure 147/88, pulse 76, temperature 97.8 F (36.6 C), temperature source Oral, resp. rate 16, height 5\' 9"  (1.753 m), weight 127.914 kg (282 lb).Body mass index is 41.63 kg/(m^2).  General Appearance: Fairly Groomed  Patent attorneyye Contact::  Fair  Speech:  Clear and Coherent  Volume:  fluctuates  Mood:  Anxious, Depressed and worried  Affect:  Restricted  Thought Process:  Coherent and Goal Directed  Orientation:  Full (Time, Place, and Person)  Thought Content:  symptoms, worries, concerns  Suicidal Thoughts:  Yes, has had plans but changes his mind at last moment  Homicidal Thoughts:  No  Memory:  Immediate;   Fair Recent;   Fair Remote;   Fair  Judgement:  Fair  Insight:  Present and Shallow  Psychomotor Activity:  Restlessness  Concentration:  Fair  Recall:  FiservFair  Fund of Knowledge:NA  Language: Fair  Akathisia:  No  Handed:    AIMS (if indicated):     Assets:  Desire for Improvement  Sleep:  Number of Hours: 5   Musculoskeletal: Strength & Muscle Tone: within normal limits Gait & Station: normal Patient leans: N/A  COGNITIVE FEATURES THAT CONTRIBUTE TO RISK:   Closed-mindedness Polarized thinking Thought constriction (tunnel vision)    SUICIDE RISK:   Moderate:  Frequent suicidal ideation with limited intensity, and duration, some specificity in terms of plans, no associated intent, good self-control, limited dysphoria/symptomatology, some risk factors present, and identifiable protective factors, including available and accessible social support.  PLAN OF CARE: Supportive approach/coping skills                               Will resume Prozac and increase to 40 mg daily  I certify that inpatient services furnished can reasonably be expected to improve the patient's condition.  Rachael Feerving A Nolia Tschantz 12/23/2013, 4:39 PM

## 2013-12-23 NOTE — Progress Notes (Addendum)
D:  Patient's self inventory sheet, patient has poor sleep, needs sleep medication, good appetite, low energy level, poor attention span.  Rated depression 3, hopeless 2, anxiety 8.  Denied withdrawals.   SI off/on, contracts for safety.  Has felt lightheaded in past 24 hours.  Plans to eat better after discharge.   A:  Medications administered per MD orders.  Emotional support and encouragement given patient. R:  Denied HI.  Denied A/V hallucinations.  SI off/on, contracts or safety.  Will continue to monitor for safety with 15 minute checks.  Safety maintained. Patient wants to take prozac, refused buspar and celexa as morning medications.  Stated he does not want his roommate to wake him up at 3:00 a.m. To tell him he is snoring.    EKG completed per MD order.

## 2013-12-23 NOTE — [facility] Counselor (Signed)
Adult Psychosocial Assessment Update Interdisciplinary Team  [facility]  Admissions Discharges  Date: 04/27/13 Date:04/30/13  Date: Date:  Date: Date:  Date: Date:  Date: Date:   Changes since the last Psychosocial Assessment (including adherence to outpatient mental health and/or substance abuse treatment, situational issues contributing to decompensation and/or relapse). Patient reports admitting to hospital with increased depression and SI with plan to hang himself.               Discharge Plan 1. Will you be returning to the same living situation after discharge?   Yes: No:      If no, what is your plan?    Yes.  Patient reports he will return to his sister's home.       2. Would you like a referral for services when you are discharged? Yes:     If yes, for what services?  No:       Yes.  [facility] but not willing to sign their forms for service.       Summary and Recommendations (to be completed by the evaluator) Geni BersBruce Rollyson is a 53 years old Caucasian male admitted with Major Depression Disorder and SI with plan to hang himself.  He will benefit from crisis stabilization, evaluation for medication, psycho-education groups for coping skills development, group therapy and case management for discharge planning.                        Signature:  Joesph JulyQuylle Hairston Lotta Frankenfield, 12/23/2013 10:27 AM

## 2013-12-23 NOTE — Progress Notes (Signed)
NUTRITION ASSESSMENT  Pt identified as at risk on the Malnutrition Screen Tool  INTERVENTION: 1. Educated patient on the importance of nutrition and encouraged intake of food and beverages.  Reviewed basics of DM diet. 2. Discussed weight goals. 3.  Higher protein HS snack.  NUTRITION DIAGNOSIS: Unintentional weight loss related to sub-optimal intake as evidenced by pt report.   Goal: Pt to meet >/= 90% of their estimated nutrition needs.  Monitor:  PO intake  Assessment:  Patient known to this Clinical research associatewriter from August admit.  Patient admitted with depression and SI.  Hx includes DM.  HgbA1C 10.  Patient did not take any of his meds since October.  Reports due to lack of funds.  Good appetite and intake currently. "Eating too much and too much junk." -prior to admit. Denied drinking sugar sweetened beverages.   Weight gain of about 15 lbs in the past 7 months.  53 y.o. male  Height: Ht Readings from Last 1 Encounters:  12/22/13 5\' 9"  (1.753 m)    Weight: Wt Readings from Last 1 Encounters:  12/22/13 282 lb (127.914 kg)    Weight Hx: Wt Readings from Last 10 Encounters:  12/22/13 282 lb (127.914 kg)  12/22/13 280 lb (127.007 kg)  04/26/13 275 lb (124.739 kg)    BMI:  Body mass index is 41.63 kg/(m^2). Pt meets criteria for extreme obesity based on current BMI.  Estimated Nutritional Needs: Kcal: 25-30 kcal/kg Protein: > 1 gram protein/kg Fluid: 1 ml/kcal  Diet Order: Carb Control Pt is also offered choice of unit snacks mid-morning and mid-afternoon.  Pt is eating as desired.   Lab results and medications reviewed.   Oran ReinLaura Jobe, RD, LDN Clinical Inpatient Dietitian Pager:  [phone] Weekend and after hours pager:  [phone]

## 2013-12-23 NOTE — Progress Notes (Signed)
Patient ID: Geni BersBruce Milleson, male   DOB: 12/03/1960, 53 y.o.   MRN: [id number]  Pt was pleasant and cooperative, but slightly anxious during the adm process. Report states pt was SI with a plan to hang himself with an extension cord. Pt was last at [facility] in Aug 2014 and stated that after taking his 2 week supply of meds provided by [facility] as well as one month of the wirtten prescriptions; he hasn't taken any meds since October. Pt stated, "I did follow up for some medications but the people in [place] wasn't a loving caring place, but I wasn't the nicest person either." Pt stated that when he was at "his best", he did good on prozac and buspar. Stated he doesn't want to take the scheduled buspar for tonight.  Pt states that for the past 2 to 3 weeks, he been drinking 3 to 4 beers weekly and has been using THC. Pt denied SI, HI, and A/V on adm.

## 2013-12-23 NOTE — [facility] Group Notes (Signed)
[facility] LCSW Group Therapy      Feelings About Diagnosis 1:15 - 2:30 PM         12/23/2013 3:21 PM    Type of Therapy:  Group Therapy  Participation Level:  Active  Participation Quality:  Appropriate  Affect:  Appropriate  Cognitive:  Alert and Appropriate  Insight:  Developing/Improving and Engaged  Engagement in Therapy:  Developing/Improving and Engaged  Modes of Intervention:  Discussion, Education, Exploration, Problem-Solving, Rapport Building, Support  Summary of Progress/Problems:  Patient actively participated in group. Patient discussed past and present diagnosis and the effects it has had on  life.  Patient talked about family and society being judgmental and the stigma associated with having a mental health diagnosis.  He shared he is learning to feel better about his diagnosis and has hope that thing will be better following this admission to the hospital.  Wynn BankerHodnett, Valente Fosberg Hairston 12/23/2013 3:21 PM

## 2013-12-24 LAB — GLUCOSE, CAPILLARY
GLUCOSE-CAPILLARY: 173 mg/dL — AB (ref 70–99)
Glucose-Capillary: 180 mg/dL — ABNORMAL HIGH (ref 70–99)
Glucose-Capillary: 222 mg/dL — ABNORMAL HIGH (ref 70–99)
Glucose-Capillary: 193 mg/dL — ABNORMAL HIGH (ref 70–99)

## 2013-12-24 MED ORDER — ONDANSETRON HCL 4 MG PO TABS
4.0000 mg | ORAL_TABLET | Freq: Once | ORAL | Status: AC
  Administered 2013-12-24: 4 mg via ORAL
  Filled 2013-12-24 (×2): qty 1

## 2013-12-24 MED ORDER — PANTOPRAZOLE SODIUM 40 MG PO TBEC
40.0000 mg | DELAYED_RELEASE_TABLET | Freq: Every day | ORAL | Status: DC
  Administered 2013-12-24 – 2013-12-26 (×3): 40 mg via ORAL
  Filled 2013-12-24 (×7): qty 1

## 2013-12-24 MED ORDER — DICYCLOMINE HCL 10 MG PO CAPS
10.0000 mg | ORAL_CAPSULE | Freq: Three times a day (TID) | ORAL | Status: DC
  Administered 2013-12-24 – 2013-12-26 (×8): 10 mg via ORAL
  Filled 2013-12-24 (×18): qty 1

## 2013-12-24 MED ORDER — TRAZODONE HCL 100 MG PO TABS
100.0000 mg | ORAL_TABLET | Freq: Every day | ORAL | Status: DC
  Administered 2013-12-24 – 2013-12-25 (×2): 100 mg via ORAL
  Filled 2013-12-24 (×5): qty 1

## 2013-12-24 NOTE — [facility] Group Notes (Signed)
[facility] LCSW Group Therapy  Emotional Regulation 1:15 - 2: 30 PM        12/24/2013  2:48 PM    Type of Therapy:  Group Therapy  Participation Level:  Appropriate  Participation Quality:  Appropriate  Affect: Depressed, Flat, Drowsy  Cognitive:  Attentive Appropriate  Insight:  Developing/Improving   Engagement in Therapy:  Developing/Improving  Modes of Intervention:  Discussion Exploration Problem-Solving Supportive  Summary of Progress/Problems:  Group topic was emotional regulations.  Patient stated he did not feel up to participating in the discussion.  Wynn BankerHodnett, Shilpa Bushee Hairston 12/24/2013

## 2013-12-24 NOTE — [facility] Group Notes (Signed)
[facility]   12/24/2013 12:41 PM    Participation Quality:  Appropraite  Mood/Affect:  Appropriate  Depression Rating:  9  Anxiety Rating:  9  Thoughts of Suicide: Yes, patient endorses off/on SI  Will you contract for safety?   Yes  Current AVH:  No  Plan for Discharge/Comments:  Patient attended discharge planning group and actively participated in group. [facility] [place]  CSW provided all participants with daily workbook.   Transportation Means: Patient has transportation.   Supports:  Patient has a limited support system.  Twisha Vanpelt, Joesph JulyQuylle Hairston

## 2013-12-24 NOTE — Progress Notes (Signed)
D: Patient's affect blunted and mood is depressed. He reported on the self inventory sheet that he has poor sleep and ability to pay attention, good appetite and low energy level. Patient rates depression "8" and feelings of hopelessness "9". He's participating in groups and interacting with peers in the dayroom. Patient adheres to medication regimen.  A: Support and encouragement provided to patient. Administered scheduled medications per ordering MD. Monitor Q15 minute checks for safety.   R: Patient receptive. Passive SI, but contracts for safety. Denies HI and AVH. Patient remains safe on the unit.

## 2013-12-24 NOTE — Progress Notes (Signed)
[facility]  12/24/2013 4:04 PM  Victor Lawrence  MRN:  [id number] Subjective:  Patient notes that he does not feel well, he states he is nauseated, reports feeling hot, and has abdominal pain that he describes as a low dull ache, that he has had before. He has a history of duodenal ulcer. He also reports he feels dizzy and sweaty.  He has not complained to his nurse of these things today.        He rates his depression is a 9/10, and his anxiety is a 9/10. He says he just doesn't feel well. Diagnosis:   DSM5: Schizophrenia Disorders:   Obsessive-Compulsive Disorders:   Trauma-Stressor Disorders:   Substance/Addictive Disorders:   Depressive Disorders:  Major Depressive Disorder - Severe (296.23) Total Time spent with patient: 30 minutes    ADL's:  Intact  Sleep: Fair  Appetite:  Poor  Suicidal Ideation:  denies Homicidal Ideation:  denies AEB (as evidenced by):  Psychiatric Specialty Exam: Physical Exam  ROS  Blood pressure 135/85, pulse 73, temperature 97.9 F (36.6 C), temperature source Oral, resp. rate 19, height 5\' 9"  (1.753 m), weight 127.914 kg (282 lb), SpO2 95.00%.Body mass index is 41.63 kg/(m^2).  General Appearance: Fairly Groomed  Patent attorneyye Contact::  Good  Speech:  Clear and Coherent  Volume:  Normal  Mood:  Irritable  Affect:  Congruent  Thought Process:  Goal Directed  Orientation:  Full (Time, Place, and Person)  Thought Content:  WDL  Suicidal Thoughts:  No  Homicidal Thoughts:  No  Memory:  NA  Judgement:  Fair  Insight:  Fair  Psychomotor Activity:  Normal  Concentration:  Fair  Recall:  Fair  Fund of Knowledge:Good  Language: Good  Akathisia:  No  Handed:  Right  AIMS (if indicated):     Assets:  Communication Skills Desire for Improvement Resilience  Sleep:  Number of Hours: 5   Musculoskeletal: Strength & Muscle Tone: within normal limits Gait & Station: normal Patient leans: N/A  Current Medications: Current  Facility-Administered Medications  Medication Dose Route Frequency Provider Last Rate Last Dose  . acetaminophen (TYLENOL) tablet 650 mg  650 mg Oral Q6H PRN Kerry HoughSpencer E Simon, PA-C      . alum & mag hydroxide-simeth (MAALOX/MYLANTA) 200-200-20 MG/5ML suspension 30 mL  30 mL Oral Q4H PRN Kerry HoughSpencer E Simon, PA-C      . FLUoxetine (PROZAC) capsule 20 mg  20 mg Oral Daily Verne SpurrNeil Mashburn, PA-C   20 mg at 12/24/13 0802  . hydrOXYzine (ATARAX/VISTARIL) tablet 50 mg  50 mg Oral QHS PRN Kerry HoughSpencer E Simon, PA-C      . ibuprofen (ADVIL,MOTRIN) tablet 400-600 mg  400-600 mg Oral Q6H PRN Kerry HoughSpencer E Simon, PA-C      . insulin aspart (novoLOG) injection 0-20 Units  0-20 Units Subcutaneous TID WC Kerry HoughSpencer E Simon, PA-C   4 Units at 12/24/13 1215  . insulin aspart (novoLOG) injection 0-5 Units  0-5 Units Subcutaneous QHS Kerry HoughSpencer E Simon, PA-C   2 Units at 12/23/13 2219  . lisinopril (PRINIVIL,ZESTRIL) tablet 20 mg  20 mg Oral Daily Kerry HoughSpencer E Simon, PA-C   20 mg at 12/24/13 0801  . magnesium hydroxide (MILK OF MAGNESIA) suspension 30 mL  30 mL Oral Daily PRN Kerry HoughSpencer E Simon, PA-C      . metFORMIN (GLUCOPHAGE) tablet 1,000 mg  1,000 mg Oral QHS Kerry HoughSpencer E Simon, PA-C   1,000 mg at 12/23/13 2218  . metFORMIN (GLUCOPHAGE) tablet 500 mg  500 mg Oral Q breakfast Nehemiah Settle, MD   500 mg at 12/24/13 0801  . mometasone-formoterol (DULERA) 100-5 MCG/ACT inhaler 2 puff  2 puff Inhalation BID Kerry Hough, PA-C   2 puff at 12/24/13 0802  . traZODone (DESYREL) tablet 50 mg  50 mg Oral QHS PRN Kerry Hough, PA-C   50 mg at 12/23/13 2219    Lab Results:  Results for orders placed during the hospital encounter of 12/22/13 (from the past 48 hour(s))  GLUCOSE, CAPILLARY     Status: Abnormal   Collection Time    12/22/13 11:08 PM      Result Value Ref Range   Glucose-Capillary 353 (*) 70 - 99 mg/dL   Comment 1 Notify RN    GLUCOSE, CAPILLARY     Status: Abnormal   Collection Time    12/23/13  6:22 AM      Result  Value Ref Range   Glucose-Capillary 202 (*) 70 - 99 mg/dL   Comment 1 Notify RN    GLUCOSE, CAPILLARY     Status: Abnormal   Collection Time    12/23/13 11:53 AM      Result Value Ref Range   Glucose-Capillary 240 (*) 70 - 99 mg/dL   Comment 1 Notify RN    GLUCOSE, CAPILLARY     Status: Abnormal   Collection Time    12/23/13  4:29 PM      Result Value Ref Range   Glucose-Capillary 278 (*) 70 - 99 mg/dL   Comment 1 Notify RN    GLUCOSE, CAPILLARY     Status: Abnormal   Collection Time    12/23/13  8:51 PM      Result Value Ref Range   Glucose-Capillary 207 (*) 70 - 99 mg/dL  GLUCOSE, CAPILLARY     Status: Abnormal   Collection Time    12/24/13  6:10 AM      Result Value Ref Range   Glucose-Capillary 180 (*) 70 - 99 mg/dL  GLUCOSE, CAPILLARY     Status: Abnormal   Collection Time    12/24/13 11:58 AM      Result Value Ref Range   Glucose-Capillary 173 (*) 70 - 99 mg/dL   Comment 1 Notify RN      Physical Findings: AIMS: Facial and Oral Movements Muscles of Facial Expression: None, normal Lips and Perioral Area: None, normal Jaw: None, normal Tongue: None, normal,Extremity Movements Upper (arms, wrists, hands, fingers): None, normal Lower (legs, knees, ankles, toes): None, normal, Trunk Movements Neck, shoulders, hips: None, normal, Overall Severity Severity of abnormal movements (highest score from questions above): None, normal Incapacitation due to abnormal movements: None, normal Patient's awareness of abnormal movements (rate only patient's report): No Awareness, Dental Status Current problems with teeth and/or dentures?: No Does patient usually wear dentures?: No  CIWA:  CIWA-Ar Total: 2 COWS:  COWS Total Score: 2  Treatment Plan Summary: Daily contact with patient to assess and evaluate symptoms and progress in treatment Medication management  Plan: 1. Continue crisis management and stabilization. 2. Medication management to reduce current symptoms to base  line and improve the patient's overall level of functioning. 3. Treat health problems as indicated. 4. Develop treatment plan to decrease risk of relapse upon discharge and to reduce the need for readmission. 5. Psycho-social education regarding relapse prevention and self care. 6. Health care follow up as needed for medical problems. 7. Will order HgA1c, for type 2DM 8. Protonix for abdominal pain. 9.  Bentyl for cramps. Medical Decision Making Problem Points:  Established problem, stable/improving (1) and New problem, with no additional work-up planned (3) Data Points:  Review of medication regiment & side effects (2)  I certify that inpatient services furnished can reasonably be expected to improve the patient's condition.   Verne Spurreil Mashburn 12/24/2013, 4:04 PM Agree with assessment and plan Madie RenoIrving A. Dub MikesLugo, M.D.

## 2013-12-24 NOTE — Progress Notes (Signed)
This pt started the day before calling the writer a RUDE FUCK while walking away from the med window, because he asked for ginger ale and Clinical research associatewriter stated he was going to get his nurse. The writer found out later that the pt had just received a can of ginger ale and the pt did not ask his nurse for  ginger ale after asking the Clinical research associatewriter.  Today pt  Came up to the nursing stating cursing at writer while another pt was also cursing at Clinical research associatewriter. Writer told this pt to take a step back because this does not involve you. Pt replied" FUCK YOU, This Does INVOLVE ME, YOU RUDE FUCK"

## 2013-12-24 NOTE — Tx Team (Signed)
Interdisciplinary Treatment Plan Update   Date Reviewed:  12/24/2013  Time Reviewed:  9:31 AM  Progress in Treatment:   Attending groups: Yes Participating in groups: Yes Taking medication as prescribed: Yes  Tolerating medication: Yes Family/Significant other contact made:  No, patient declilned collateral contact Patient understands diagnosis: Yes  Discussing patient identified problems/goals with staff: Yes Medical problems stabilized or resolved: Yes Denies suicidal/homicidal ideation: Np, off/on SI but contracts for safety Patient has not harmed self or others: Yes  For review of initial/current patient goals, please see plan of care.  Estimated Length of Stay:  3-4 days  Reasons for Continued Hospitalization:  Anxiety Depression Medication stabilization Suicidal ideation  New Problems/Goals identified:    Discharge Plan or Barriers:   Home with outpatient follow up to be determined  Additional Comments:  Attendees:  Patient:  12/24/2013 9:31 AM   Signature:  12/24/2013 9:31 AM  Signature:  Verne SpurrNeil Mashburn, PA 12/24/2013 9:31 AM  Signature:   12/24/2013 9:31 AM  Signature:  Langley AdieAdam Miller, RN 12/24/2013 9:31 AM  Signature:   12/24/2013 9:31 AM  Signature:  Juline PatchQuylle Maribelle Hopple, LCSW 12/24/2013 9:31 AM  Signature:  Reyes Ivanhelsea Horton, LCSW 12/24/2013 9:31 AM  Signature:  Leisa LenzValerie Enoch, Care Coordinator [facility] 12/24/2013 9:31 AM  Signature:  12/24/2013 9:31 AM  Signature: Leighton ParodyBritney Tyson, RN 12/24/2013  9:31 AM  Signature:   Onnie BoerJennifer Clark, RN [facility] 12/24/2013  9:31 AM  Signature:  Harold Barbanonecia Byrd, RN 12/24/2013  9:31 AM    Scribe for Treatment Team:   Juline PatchQuylle Jeanine Caven,  12/24/2013 9:31 AM

## 2013-12-24 NOTE — Progress Notes (Signed)
Adult Psychoeducational Group Note  Date:  12/23/2013 Time:  11:38 PM  Group Topic/Focus:  Wrap-Up Group:   The focus of this group is to help patients review their daily goal of treatment and discuss progress on daily workbooks.  Participation Level:  Active  Participation Quality:  Appropriate  Affect:  Appropriate  Cognitive:  Appropriate  Insight: Appropriate  Engagement in Group:  Engaged  Modes of Intervention:  Discussion  Additional Comments:  Pt was present for wrap up group. He stated that he learned "how sick he is" and that he needs to take care of his diabetes. He plans to pay better attention to his blood sugar. He said that it is good to get out of isolation and be around people.  Shilah Hefel A Sumiye Hirth 12/23/2013, 11:38 PM

## 2013-12-24 NOTE — Progress Notes (Signed)
Adult Psychoeducational Group Note  Date:  12/24/2013 Time:  9:33 PM  Group Topic/Focus:  Wrap-Up Group:   The focus of this group is to help patients review their daily goal of treatment and discuss progress on daily workbooks.  Participation Level:  Minimal  Participation Quality:  Intrusive and Resistant  Affect:  Blunted, Defensive, Irritable and Resistant  Cognitive:  Appropriate  Insight: Limited  Engagement in Group:  Poor and Resistant  Modes of Intervention:  Redirection  Additional Comments:  Pt was very resistant to sharing when it was his turn he indicated that he did not want to share and when prompted to share he was very abrupt and rude to staff   Victor Lawrence 12/24/2013, 9:33 PM

## 2013-12-24 NOTE — Progress Notes (Signed)
Pt observed in the dayroom earlier talking with peers.  He reports he is doing ok this evening.  He denies HI/AV.  He says he still has passive thoughts to hurt himself, but he can contract for safety.  Pt says he is trying to be compliant with his diet.  He states his depression is still high.  Pt makes his needs known to staff.  Pt also can be irritable and demanding at time.  The other RN on the hall reported that pt had requested ginger ale from him after this writer had given him ginger ale earlier.  When other RN asked pt to give him a minute to check with Clinical research associatewriter, pt cursed him and walked back to the dayroom.  Pt could be heard talking to the other patients, telling them that the RN was rude to him and wanting to know if anyone else had trouble with RN as if to start a conflict on the hall.  The situation was resolved without incident.  Support and encouragement offered.  Safety maintained with q15 minute checks.

## 2013-12-25 LAB — GLUCOSE, CAPILLARY
Glucose-Capillary: 188 mg/dL — ABNORMAL HIGH (ref 70–99)
Glucose-Capillary: 232 mg/dL — ABNORMAL HIGH (ref 70–99)
Glucose-Capillary: 247 mg/dL — ABNORMAL HIGH (ref 70–99)
Glucose-Capillary: 323 mg/dL — ABNORMAL HIGH (ref 70–99)

## 2013-12-25 LAB — HEMOGLOBIN A1C
Hgb A1c MFr Bld: 11.4 % — ABNORMAL HIGH (ref <5.7)
Mean Plasma Glucose: 280 mg/dL — ABNORMAL HIGH (ref <117)

## 2013-12-25 MED ORDER — DIVALPROEX SODIUM 500 MG PO DR TAB
500.0000 mg | DELAYED_RELEASE_TABLET | Freq: Every day | ORAL | Status: DC
  Administered 2013-12-25: 500 mg via ORAL
  Filled 2013-12-25 (×3): qty 1

## 2013-12-25 NOTE — Progress Notes (Signed)
Adult Psychoeducational Group Note  Date:  12/25/2013 Time:  10:00am  Group Topic/Focus:  Personal Choices and Values:   The focus of this group is to help patients assess and explore the importance of values in their lives, how their values affect their decisions, how they express their values and what opposes their expression.  Participation Level:  Did Not Attend  Participation Quality:    Affect:    Cognitive:    Insight:   Engagement in Group:    Modes of Intervention:    Additional Comments:  Pt did not attend.  Pryor Curiaanya D Garner 12/25/2013, 12:04 PM

## 2013-12-25 NOTE — Progress Notes (Signed)
D: Patient continues to present with blunted affect and depressed/irritable mood. He reported on the self inventory sheet that sleep and ability to pay attention are poor, appetite is good and energy level is low. Patient rates depression and feelings of hopelessness "8". He's attending the scheduled groups throughout the day. Compliant with medications.  A: Support and encouragement provided to patient. Scheduled medications administered per MD orders. Maintain Q15 minute checks for safety.  R: Patient receptive. Passive SI, but contracts for safety. Denies HI. Patient remains safe.

## 2013-12-25 NOTE — Progress Notes (Signed)
Adult Psychoeducational Group Note  Date:  12/24/2013 Time:  10:00am Group Topic/Focus:  Personal Choices and Values:   The focus of this group is to help patients assess and explore the importance of values in their lives, how their values affect their decisions, how they express their values and what opposes their expression.  Participation Level:  Minimal  Participation Quality:  Drowsy  Affect:  Appropriate  Cognitive:  Alert and Appropriate  Insight: Appropriate  Engagement in Group:  Lacking  Modes of Intervention:  Discussion and Education  Additional Comments:  Pt attended group for a short time and then left. Pt participation was very limited. Discussion was on personal development and what it means to you? Pt stated personal development meant doing what I need to do and not what I want.  Pryor Curiaanya D Garner 12/25/2013, 4:03 AM

## 2013-12-25 NOTE — Progress Notes (Signed)
Pt has been irritable and sarcastic towards staff all evening.  Pt has been in the dayroom most of the evening talking with peers.  Pt is irritated that the galley was not stocked with snacks for diabetics, but writer found the diabetic cookies in the other med room and informed pt that they were available.  Then he asked about the [place] sandwich that he is supposed to get in the evenings.  The sandwich was not in the fridge in the med room nor was it in the [facility].  Writer checked in [employer], but all the sandwiches in that fridge dated 4/16.  Pt is upset that his needs are not being met. Printmaker and informed pt that the cafeteria was closed and no one was available to fix a fresh sandwich.  Pt was offered the diabetic cookies at that time, but he refused, saying he needed the protein.  A short time later he returned and said he would take the cookies.  Pt's blood sugar levels are decreasing and was 193 tonight, so he did not need night coverage.  Pt still saying he is passive SI, but contracts for safety.  Pt is going to groups.  He strongly makes his needs known to staff.  Support and encouragement offered.  Safety maintained with q15 minute checks.

## 2013-12-25 NOTE — Progress Notes (Signed)
Patient ID: Victor Lawrence, male   DOB: 03/03/1961, 53 y.o.   MRN: [id number] [facility]  12/25/2013 2:09 PM  Victor Lawrence  MRN:  [id number] Subjective:    Patient reports that he is feeling a little better today. We have discussed using a mood stabilizer such as Depakote and he is interested in doing this.  He continues to report physical complaints but is overall better. He is talkative and cooperative, states his depression is a little better.   Diagnosis:   DSM5: Schizophrenia Disorders:   Obsessive-Compulsive Disorders:   Trauma-Stressor Disorders:   Substance/Addictive Disorders:   Depressive Disorders:  Major Depressive Disorder - Severe (296.23) Total Time spent with patient: 30 minutes ADL's:  Intact  Sleep: Fair  Appetite:  Poor  Suicidal Ideation:  denies Homicidal Ideation:  denies AEB (as evidenced by):  Psychiatric Specialty Exam: Physical Exam  ROS  Blood pressure 119/77, pulse 87, temperature 98.7 F (37.1 C), temperature source Oral, resp. rate 16, height 5\' 9"  (1.753 m), weight 127.914 kg (282 lb), SpO2 95.00%.Body mass index is 41.63 kg/(m^2).  General Appearance: Fairly Groomed  Patent attorneyye Contact::  Good  Speech:  Clear and Coherent  Volume:  Normal  Mood:  Irritable  Affect:  Congruent  Thought Process:  Goal Directed  Orientation:  Full (Time, Place, and Person)  Thought Content:  WDL  Suicidal Thoughts:  No  Homicidal Thoughts:  No  Memory:  NA  Judgement:  Fair  Insight:  Fair  Psychomotor Activity:  Normal  Concentration:  Fair  Recall:  Fair  Fund of Knowledge:Good  Language: Good  Akathisia:  No  Handed:  Right  AIMS (if indicated):     Assets:  Communication Skills Desire for Improvement Resilience  Sleep:  Number of Hours: 5   Musculoskeletal: Strength & Muscle Tone: within normal limits Gait & Station: normal Patient leans: N/A  Current Medications: Current Facility-Administered Medications  Medication Dose Route Frequency  Provider Last Rate Last Dose  . acetaminophen (TYLENOL) tablet 650 mg  650 mg Oral Q6H PRN Kerry HoughSpencer E Simon, PA-C      . alum & mag hydroxide-simeth (MAALOX/MYLANTA) 200-200-20 MG/5ML suspension 30 mL  30 mL Oral Q4H PRN Kerry HoughSpencer E Simon, PA-C      . dicyclomine (BENTYL) capsule 10 mg  10 mg Oral TID AC & HS Verne SpurrNeil Mashburn, PA-C   10 mg at 12/25/13 1210  . FLUoxetine (PROZAC) capsule 20 mg  20 mg Oral Daily Verne SpurrNeil Mashburn, PA-C   20 mg at 12/25/13 [id number]  . hydrOXYzine (ATARAX/VISTARIL) tablet 50 mg  50 mg Oral QHS PRN Kerry HoughSpencer E Simon, PA-C      . ibuprofen (ADVIL,MOTRIN) tablet 400-600 mg  400-600 mg Oral Q6H PRN Kerry HoughSpencer E Simon, PA-C      . insulin aspart (novoLOG) injection 0-20 Units  0-20 Units Subcutaneous TID WC Kerry HoughSpencer E Simon, PA-C   7 Units at 12/25/13 1211  . insulin aspart (novoLOG) injection 0-5 Units  0-5 Units Subcutaneous QHS Kerry HoughSpencer E Simon, PA-C   2 Units at 12/23/13 2219  . lisinopril (PRINIVIL,ZESTRIL) tablet 20 mg  20 mg Oral Daily Kerry HoughSpencer E Simon, PA-C   20 mg at 12/25/13 0758  . magnesium hydroxide (MILK OF MAGNESIA) suspension 30 mL  30 mL Oral Daily PRN Kerry HoughSpencer E Simon, PA-C      . metFORMIN (GLUCOPHAGE) tablet 1,000 mg  1,000 mg Oral QHS Kerry HoughSpencer E Simon, PA-C   1,000 mg at 12/24/13 2232  . metFORMIN (GLUCOPHAGE)  tablet 500 mg  500 mg Oral Q breakfast Nehemiah Settle, MD   500 mg at 12/25/13 0759  . mometasone-formoterol (DULERA) 100-5 MCG/ACT inhaler 2 puff  2 puff Inhalation BID Kerry Hough, PA-C   2 puff at 12/25/13 0759  . pantoprazole (PROTONIX) EC tablet 40 mg  40 mg Oral Daily Verne Spurr, PA-C   40 mg at 12/25/13 0759  . traZODone (DESYREL) tablet 100 mg  100 mg Oral QHS Verne Spurr, PA-C   100 mg at 12/24/13 2232    Lab Results:  Results for orders placed during the hospital encounter of 12/22/13 (from the past 48 hour(s))  GLUCOSE, CAPILLARY     Status: Abnormal   Collection Time    12/23/13  4:29 PM      Result Value Ref Range    Glucose-Capillary 278 (*) 70 - 99 mg/dL   Comment 1 Notify RN    GLUCOSE, CAPILLARY     Status: Abnormal   Collection Time    12/23/13  8:51 PM      Result Value Ref Range   Glucose-Capillary 207 (*) 70 - 99 mg/dL  GLUCOSE, CAPILLARY     Status: Abnormal   Collection Time    12/24/13  6:10 AM      Result Value Ref Range   Glucose-Capillary 180 (*) 70 - 99 mg/dL  GLUCOSE, CAPILLARY     Status: Abnormal   Collection Time    12/24/13 11:58 AM      Result Value Ref Range   Glucose-Capillary 173 (*) 70 - 99 mg/dL   Comment 1 Notify RN    GLUCOSE, CAPILLARY     Status: Abnormal   Collection Time    12/24/13  4:58 PM      Result Value Ref Range   Glucose-Capillary 222 (*) 70 - 99 mg/dL   Comment 1 Notify RN    GLUCOSE, CAPILLARY     Status: Abnormal   Collection Time    12/24/13  8:59 PM      Result Value Ref Range   Glucose-Capillary 193 (*) 70 - 99 mg/dL   Comment 1 Notify RN    HEMOGLOBIN A1C     Status: Abnormal   Collection Time    12/24/13  9:00 PM      Result Value Ref Range   Hemoglobin A1C 11.4 (*) <5.7 %   Comment: (NOTE)                                                                               According to the ADA Clinical Practice Recommendations for 2011, when     HbA1c is used as a screening test:      >=6.5%   Diagnostic of Diabetes Mellitus               (if abnormal result is confirmed)     5.7-6.4%   Increased risk of developing Diabetes Mellitus     References:Diagnosis and Classification of Diabetes Mellitus,Diabetes     Care,2011,34(Suppl 1):S62-S69 and Standards of Medical Care in             Diabetes - 2011,Diabetes Care,2011,34 (Suppl 1):S11-S61.   Mean Plasma Glucose  280 (*) <117 mg/dL   Comment: [facility]  GLUCOSE, CAPILLARY     Status: Abnormal   Collection Time    12/25/13  6:12 AM      Result Value Ref Range   Glucose-Capillary 188 (*) 70 - 99 mg/dL   Comment 1 Notify RN    GLUCOSE, CAPILLARY     Status: Abnormal    Collection Time    12/25/13 11:51 AM      Result Value Ref Range   Glucose-Capillary 232 (*) 70 - 99 mg/dL    Physical Findings: AIMS: Facial and Oral Movements Muscles of Facial Expression: None, normal Lips and Perioral Area: None, normal Jaw: None, normal Tongue: None, normal,Extremity Movements Upper (arms, wrists, hands, fingers): None, normal Lower (legs, knees, ankles, toes): None, normal, Trunk Movements Neck, shoulders, hips: None, normal, Overall Severity Severity of abnormal movements (highest score from questions above): None, normal Incapacitation due to abnormal movements: None, normal Patient's awareness of abnormal movements (rate only patient's report): No Awareness, Dental Status Current problems with teeth and/or dentures?: No Does patient usually wear dentures?: No  CIWA:  CIWA-Ar Total: 2 COWS:  COWS Total Score: 2  Treatment Plan Summary: Daily contact with patient to assess and evaluate symptoms and progress in treatment Medication management  Plan: 1. Continue crisis management and stabilization. 2. Medication management to reduce current symptoms to base line and improve the patient's overall level of functioning. 3. Treat health problems as indicated. 4. Develop treatment plan to decrease risk of relapse upon discharge and to reduce the need for readmission. 5. Psycho-social education regarding relapse prevention and self care. 6. Health care follow up as needed for medical problems. 7. Depakote 500 mg po at hs. For mood stabilization. 8. Diabetic Educator consult for diabetes.   Medical Decision Making Problem Points:  Established problem, stable/improving (1) and New problem, with no additional work-up planned (3) Data Points:  Review of medication regiment & side effects (2)  I certify that inpatient services furnished can reasonably be expected to improve the patient's condition.  Rona RavensNeil T. Mashburn RPAC 2:20 PM 12/25/2013 Discussed the  patient agree with assessment and plan Madie RenoIrving A. Dub MikesLugo, M.D.,

## 2013-12-25 NOTE — [facility] Group Notes (Signed)
[facility] LCSW Group Therapy  12/25/2013   1:15 PM   Type of Therapy:  Group Therapy  Participation Level:  Active  Participation Quality:  Attentive, Sharing and Supportive  Affect:  Depressed and Flat  Cognitive:  Alert and Oriented  Insight:  Developing/Improving and Engaged  Engagement in Therapy:  Developing/Improving and Engaged  Modes of Intervention:  Activity, Clarification, Confrontation, Discussion, Education, Exploration, Limit-setting, Orientation, Problem-solving, Rapport Building, Reality Testing, Socialization and Support  Summary of Progress/Problems: Patient was attentive and engaged with speaker from [employer].  Patient was attentive to speaker while they shared their story of dealing with mental health and overcoming it.  Patient expressed interest in their programs and services and received information on their agency.  Patient processed ways they can relate to the speaker.     Nawaf Strange Horton, LCSW 12/25/2013  2:30 PM   

## 2013-12-25 NOTE — Progress Notes (Signed)
D:  Patient in the dayroom watching television on approach.  Patient states he had a better day today.  Patient states he is working on his moods and hopes that the Depakote he was prescribed will help.  Patient states mood disorder is new to him and he states it will be a learning process.  Patient denies SI/HI and denies AVH A: Staff to monitor Q 15 mins for safety.  Encouragement and support offered.  Scheduled medications administered per orders. R: Patient remains safe on the unit.  Patient attended group tonight.  Patient visible on the unit and interacting with peers.  Patient taking administered medications.

## 2013-12-25 NOTE — Progress Notes (Signed)
Adult Psychoeducational Group Note  Date:  12/25/2013 Time:  9:00 AM  Group Topic/Focus:  Morning Wellness Group  Participation Level:  Minimal  Participation Quality:  Appropriate and Attentive  Affect:  Appropriate  Cognitive:  Alert and Oriented  Insight: Appropriate  Engagement in Group:  Engaged  Modes of Intervention:  Discussion  Additional Comments: Patient voiced that his goal is to get his medications right and discharge.  Melanee SpryRonecia E Asma Boldon 12/25/2013, 12:35 PM

## 2013-12-26 DIAGNOSIS — F329 Major depressive disorder, single episode, unspecified: Secondary | ICD-10-CM

## 2013-12-26 DIAGNOSIS — F39 Unspecified mood [affective] disorder: Secondary | ICD-10-CM

## 2013-12-26 LAB — GLUCOSE, CAPILLARY
GLUCOSE-CAPILLARY: 180 mg/dL — AB (ref 70–99)
Glucose-Capillary: 248 mg/dL — ABNORMAL HIGH (ref 70–99)

## 2013-12-26 MED ORDER — PANTOPRAZOLE SODIUM 40 MG PO TBEC: 40.0000 mg | DELAYED_RELEASE_TABLET | Freq: Every day | ORAL | Status: DC

## 2013-12-26 MED ORDER — LISINOPRIL 20 MG PO TABS: 20.0000 mg | ORAL_TABLET | Freq: Every day | ORAL | Status: DC

## 2013-12-26 MED ORDER — TRAZODONE HCL 100 MG PO TABS: 100.0000 mg | ORAL_TABLET | Freq: Every day | ORAL | Status: DC

## 2013-12-26 MED ORDER — METFORMIN HCL 500 MG PO TABS: ORAL_TABLET | ORAL | Status: AC

## 2013-12-26 MED ORDER — FLUOXETINE HCL 20 MG PO CAPS: 20.0000 mg | ORAL_CAPSULE | Freq: Every day | ORAL | Status: AC

## 2013-12-26 MED ORDER — DIVALPROEX SODIUM 500 MG PO DR TAB: 500.0000 mg | DELAYED_RELEASE_TABLET | Freq: Every day | ORAL | Status: DC

## 2013-12-26 NOTE — Progress Notes (Signed)
Discharge Note: Discharge instructions/prescriptions/medication samples given to patient. Patient verbalized understanding of discharge instructions and prescriptions. Returned belongings to patient. Denies SI/HI/AVH. Patient d/c without incident to the front lobby and transported himself home.

## 2013-12-26 NOTE — Progress Notes (Signed)
Patient ID: Victor Lawrence, male   DOB: 09/29/1960, 53 y.o.   MRN: [id number] D: pt. Lying in bed, eyes closed, respirations even. A: Writer observed patient for s/s of distress. Staff will monitor q5115min for safety. R: Pt. Is safe on the unit, no distress noted, respirations unlabored.

## 2013-12-26 NOTE — Progress Notes (Signed)
[facility] Discharge [facility] :  Will you be returning to the same living situation after discharge: Yes,  Patient is returning to his home. At discharge, do you have transportation home?:Yes,  Patient to arrange transportation home. Do you have the ability to pay for your medications:No.  Patient needs assistance with indigent medications   Release of information consent forms completed and in the chart;  Patient's signature needed at discharge.  Patient to Follow up at: Follow-up Information   Follow up with [facility] On 12/30/2013. (Tuesday, December 30, 2012 between Quincy Carnes8AM -3PM - Referral [phone]#[id number])    Contact information:   [address] [place], [place]   [id number]  [phone]      Patient denies SI/HI:  Patient no longer endorsing SI/HI or other thoughts of self harm.   Safety Planning and Suicide Prevention discussed: .Reviewed with all patients during discharge planning group  Victor Lawrence Victor Lawrence 12/26/2013, 10:37 AM

## 2013-12-26 NOTE — Tx Team (Signed)
Interdisciplinary Treatment Plan Update   Date Reviewed:  12/26/2013  Time Reviewed:  8:42 AM  Progress in Treatment:   Attending groups: Yes Participating in groups: Yes Taking medication as prescribed: Yes  Tolerating medication: Yes Family/Significant other contact made:  No, patient declined collateral contact Patient understands diagnosis: Yes  Discussing patient identified problems/goals with staff: Yes Medical problems stabilized or resolved: Yes Denies suicidal/homicidal ideation: Np, off/on SI but contracts for safety Patient has not harmed self or others: Yes  For review of initial/current patient goals, please see plan of care.  Estimated Length of Stay:  Discharge today  Reasons for Continued Hospitalization:   New Problems/Goals identified:    Discharge Plan or Barriers:   Home with [facility] [place]  Additional Comments:  Attendees:  Patient:  12/26/2013 8:42 AM   Signature:  Geoffery LyonsIrving Lugo, MD 12/26/2013 8:42 AM  Signature:  Verne SpurrNeil Mashburn, PA 12/26/2013 8:42 AM  Signature:   12/26/2013 8:42 AM  Signature: Quintella ReichertBeverly Knight, RN 12/26/2013 8:42 AM  Signature:   12/26/2013 8:42 AM  Signature:  Juline PatchQuylle Emoree Sasaki, LCSW 12/26/2013 8:42 AM  Signature:  Reyes Ivanhelsea Horton, LCSW 12/26/2013 8:42 AM  Signature:  Leisa LenzValerie Enoch, Care Coordinator [facility] 12/26/2013 8:42 AM  Signature:  12/26/2013 8:42 AM  Signature:  12/26/2013  8:42 AM  Signature:   Onnie BoerJennifer Clark, RN [facility] 12/26/2013  8:42 AM  Signature:  Harold Barbanonecia Byrd, RN 12/26/2013  8:42 AM    Scribe for Treatment Team:   Juline PatchQuylle Mihir Flanigan,  12/26/2013 8:42 AM

## 2013-12-26 NOTE — [facility] Suicide Risk Assessment (Signed)
Suicide Risk Assessment  Discharge Assessment     Demographic Factors:  Male and Caucasian  Total Time spent with patient: 45 minutes  Psychiatric Specialty Exam:     Blood pressure 135/81, pulse 89, temperature 98 F (36.7 C), temperature source Oral, resp. rate 20, height 5\' 9"  (1.753 m), weight 127.914 kg (282 lb), SpO2 95.00%.Body mass index is 41.63 kg/(m^2).  General Appearance: Fairly Groomed  Patent attorneyye Contact::  Fair  Speech:  Clear and Coherent  Volume:  Normal  Mood:  worried  Affect:  Appropriate  Thought Process:  Coherent and Goal Directed  Orientation:  Full (Time, Place, and Person)  Thought Content:  symptoms, worries, concenrs  Suicidal Thoughts:  No  Homicidal Thoughts:  No  Memory:  Immediate;   Fair Recent;   Fair Remote;   Fair  Judgement:  Fair  Insight:  Present  Psychomotor Activity:  Restlessness  Concentration:  Fair  Recall:  FiservFair  Fund of Knowledge:NA  Language: Fair  Akathisia:  No  Handed:    AIMS (if indicated):     Assets:  Desire for Improvement Housing Social Support  Sleep:  Number of Hours: 6.5    Musculoskeletal: Strength & Muscle Tone: within normal limits Gait & Station: normal Patient leans: N/A   Mental Status Per Nursing Assessment::   On Admission:  NA  Current Mental Status by Physician: IN full contact with reality. No active suicidal ideas, plans or intent. His mood is worried affect worried. He is trying to understand the assessment of mood instability  and the rationale behind using Depakote. Willing to give it a try.   Loss Factors: Decrease in vocational status and Financial problems/change in socioeconomic status  Historical Factors: NA  Risk Reduction Factors:   Living with another person, especially a relative and Positive social support  Continued Clinical Symptoms:  Depression:   Impulsivity  Cognitive Features That Contribute To Risk:  Closed-mindedness Polarized thinking Thought constriction  (tunnel vision)    Suicide Risk:  Minimal: No identifiable suicidal ideation.  Patients presenting with no risk factors but with morbid ruminations; may be classified as minimal risk based on the severity of the depressive symptoms  Discharge Diagnoses:   AXIS I:  Major depressive disorder, mood disorder NOS AXIS II:  No diagnosis AXIS III:   Past Medical History  Diagnosis Date  . Hypertension   . Diabetes mellitus without complication   . Arthritis   . Depression   . Kidney stones   . Reflux   . Shortness of breath   . Hyperlipidemia    AXIS IV:  other psychosocial or environmental problems AXIS V:  61-70 mild symptoms  Plan Of Care/Follow-up recommendations:  Activity:  as tolerated Diet:  regular Follow up outpatient basis Is patient on multiple antipsychotic therapies at discharge:  No   Has Patient had three or more failed trials of antipsychotic monotherapy by history:  No  Recommended Plan for Multiple Antipsychotic Therapies: NA    Rachael FeeIrving A Cheryl Stabenow 12/26/2013, 12:40 PM

## 2013-12-26 NOTE — Discharge Summary (Signed)
Physician Discharge Summary Note  Patient:  Victor Lawrence is an 53 y.o., male MRN:  [id number] DOB:  07-02-1961 Patient phone:  [phone] (home)  Patient address:   [address]  [place] [address],  Total Time spent with patient: 30 minutes  Date of Admission:  12/22/2013 Date of Discharge: 12/26/2013  Reason for Admission:  MDD with plan  Discharge Diagnoses: Active Problems:   MDD (major depressive disorder)   Unspecified episodic mood disorder   Psychiatric Specialty Exam:  Please see D/C SRA Physical Exam  ROS  Blood pressure 135/81, pulse 89, temperature 98 F (36.7 C), temperature source Oral, resp. rate 20, height 5\' 9"  (1.753 m), weight 127.914 kg (282 lb), SpO2 95.00%.Body mass index is 41.63 kg/(m^2).  General Appearance:   Eye Contact::    Speech:    Volume:    Mood:    Affect:    Thought Process:    Orientation:    Thought Content:    Suicidal Thoughts:    Homicidal Thoughts:    Memory:    Judgement:    Insight:    Psychomotor Activity:    Concentration:    Recall:    Fund of Knowledge:  Language:   Akathisia:    Handed:    AIMS (if indicated):     Assets:    Sleep:  Number of Hours: 6.5    Past Psychiatric History: Diagnosis:  Hospitalizations:  Outpatient Care:  Substance Abuse Care:  Self-Mutilation:  Suicidal Attempts:  Violent Behaviors:   Musculoskeletal: Strength & Muscle Tone:  Gait & Station:  Patient leans:   DSM5:  Schizophrenia Disorders:   Obsessive-Compulsive Disorders:   Trauma-Stressor Disorders:   Substance/Addictive Disorders:  Alcohol Related Disorder - Severe (303.90)  Cannabis related disorder Depressive Disorders:  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  Hypertension    .  Diabetes mellitus without complication    .  Arthritis     .  Depression    .  Kidney stones    .  Reflux    .  Shortness of breath    .  Hyperlipidemia     AXIS IV: other psychosocial or environmental problems  AXIS V: 61-70 mild symptoms  Level of Care:  OP  Hospital Course:         Victor Lawrence is an 53 y.o. male. Patient drove self to the ED because thoughts of suicide with plan to hang self. Patient reports putting an extension cord in his truck and today driving around search for the best tree to hang himself.        Victor Lawrence was admitted to the adult unit. He was evaluated and his symptoms were identified. Medication management was discussed and initiated. He was oriented to the unit and encouraged to participate in unit programming. Medical problems were identified and treated appropriately. Home medication was restarted as needed. He was treated for his hypertension and his type two diabetes. Upon admission he was irritable, grumpy and critical, noting that he was hopeless and helpless.       The patient was evaluated each day by a clinical provider to ascertain the patient's response to treatment. Improvement was noted by the patient's report of decreasing symptoms, improved sleep and appetite, affect, medication tolerance, behavior, and participation in unit programming.  Victor Lawrence was asked each day to complete a self inventory noting mood, mental status, pain, new symptoms, anxiety and concerns.       He responded well to medication and being in a therapeutic and supportive environment. A mood stabilizer was added to help with his anger and irritability. Positive and appropriate behavior was noted and the patient was motivated for recovery. Victor Lawrence closely with the treatment team and case manager to develop a discharge plan with appropriate goals. Coping skills, problem solving as well as relaxation therapies were also part of the unit programming.        By the day of discharge Victor Lawrence was in much improved condition than upon  admission. Symptoms were reported as significantly decreased or resolved completely. The patient denied SI/HI and voiced no AVH. He was motivated to continue taking medication with a goal of continued improvement in mental health.  Victor Lawrence was discharged home with a plan to follow up as noted below.     Consults:  None  Significant Diagnostic Studies:  None  Discharge Vitals:   Blood pressure 135/81, pulse 89, temperature 98 F (36.7 C), temperature source Oral, resp. rate 20, height 5\' 9"  (1.753 m), weight 127.914 kg (282 lb), SpO2 95.00%. Body mass index is 41.63 kg/(m^2). Lab Results:   Results for orders placed during the hospital encounter of 12/22/13 (from the past 72 hour(s))  GLUCOSE, CAPILLARY     Status: Abnormal   Collection Time    12/23/13  4:29 PM      Result Value Ref Range   Glucose-Capillary 278 (*) 70 - 99 mg/dL   Comment 1 Notify RN    GLUCOSE, CAPILLARY     Status: Abnormal   Collection Time    12/23/13  8:51 PM      Result Value Ref Range   Glucose-Capillary 207 (*) 70 - 99 mg/dL  GLUCOSE, CAPILLARY     Status: Abnormal   Collection Time    12/24/13  6:10 AM      Result Value Ref Range   Glucose-Capillary 180 (*) 70 - 99 mg/dL  GLUCOSE, CAPILLARY     Status: Abnormal   Collection Time    12/24/13 11:58 AM      Result Value Ref Range   Glucose-Capillary 173 (*) 70 - 99 mg/dL   Comment 1 Notify RN    GLUCOSE, CAPILLARY     Status: Abnormal   Collection Time    12/24/13  4:58 PM      Result Value Ref Range   Glucose-Capillary 222 (*) 70 - 99 mg/dL   Comment 1 Notify RN    GLUCOSE, CAPILLARY     Status: Abnormal   Collection Time    12/24/13  8:59 PM      Result Value Ref Range   Glucose-Capillary 193 (*) 70 - 99 mg/dL   Comment 1 Notify RN    HEMOGLOBIN A1C     Status: Abnormal   Collection Time    12/24/13  9:00 PM      Result Value Ref Range   Hemoglobin A1C 11.4 (*) <5.7 %   Comment: (NOTE)  According to the ADA Clinical Practice Recommendations for 2011, when     HbA1c is used as a screening test:      >=6.5%   Diagnostic of Diabetes Mellitus               (if abnormal result is confirmed)     5.7-6.4%   Increased risk of developing Diabetes Mellitus     References:Diagnosis and Classification of Diabetes Mellitus,Diabetes     Care,2011,34(Suppl 1):S62-S69 and Standards of Medical Care in             Diabetes - 2011,Diabetes Care,2011,34 (Suppl 1):S11-S61.   Mean Plasma Glucose 280 (*) <117 mg/dL   Comment: Performed at [facility]  GLUCOSE, CAPILLARY     Status: Abnormal   Collection Time    12/25/13  6:12 AM      Result Value Ref Range   Glucose-Capillary 188 (*) 70 - 99 mg/dL   Comment 1 Notify RN    GLUCOSE, CAPILLARY     Status: Abnormal   Collection Time    12/25/13 11:51 AM      Result Value Ref Range   Glucose-Capillary 232 (*) 70 - 99 mg/dL  GLUCOSE, CAPILLARY     Status: Abnormal   Collection Time    12/25/13  4:57 PM      Result Value Ref Range   Glucose-Capillary 247 (*) 70 - 99 mg/dL  GLUCOSE, CAPILLARY     Status: Abnormal   Collection Time    12/25/13  8:03 PM      Result Value Ref Range   Glucose-Capillary 323 (*) 70 - 99 mg/dL  GLUCOSE, CAPILLARY     Status: Abnormal   Collection Time    12/26/13  6:24 AM      Result Value Ref Range   Glucose-Capillary 180 (*) 70 - 99 mg/dL  GLUCOSE, CAPILLARY     Status: Abnormal   Collection Time    12/26/13 11:57 AM      Result Value Ref Range   Glucose-Capillary 248 (*) 70 - 99 mg/dL   Comment 1 Notify RN      Physical Findings: AIMS: Facial and Oral Movements Muscles of Facial Expression: None, normal Lips and Perioral Area: None, normal Jaw: None, normal Tongue: None, normal,Extremity Movements Upper (arms, wrists, hands, fingers): None, normal Lower (legs, knees, ankles, toes): None, normal, Trunk Movements Neck, shoulders, hips: None, normal, Overall  Severity Severity of abnormal movements (highest score from questions above): None, normal Incapacitation due to abnormal movements: None, normal Patient's awareness of abnormal movements (rate only patient's report): No Awareness, Dental Status Current problems with teeth and/or dentures?: No Does patient usually wear dentures?: No  CIWA:  CIWA-Ar Total: 2 COWS:  COWS Total Score: 2  Psychiatric Specialty Exam: See Psychiatric Specialty Exam and Suicide Risk Assessment completed by Attending Physician prior to discharge.  Discharge destination:  Home  Is patient on multiple antipsychotic therapies at discharge:  No   Has Patient had three or more failed trials of antipsychotic monotherapy by history:  No  Recommended Plan for Multiple Antipsychotic Therapies: NA  Discharge Orders   Future Orders Complete By Expires   Diet - low sodium heart healthy  As directed    Discharge instructions  As directed    Increase activity slowly  As directed        Medication List    STOP taking these medications       ibuprofen 200 MG tablet  Commonly known  as:  ADVIL,MOTRIN     mometasone-formoterol 100-5 MCG/ACT Aero  Commonly known as:  DULERA      TAKE these medications     Indication   divalproex 500 MG DR tablet  Commonly known as:  DEPAKOTE  Take 1 tablet (500 mg total) by mouth at bedtime. For mood stabilization.   Indication:  Manic Phase of Manic-Depression     FLUoxetine 20 MG capsule  Commonly known as:  PROZAC  Take 1 capsule (20 mg total) by mouth daily.   Indication:  Depression, Major Depressive Disorder     lisinopril 20 MG tablet  Commonly known as:  PRINIVIL,ZESTRIL  Take 1 tablet (20 mg total) by mouth daily.   Indication:  High Blood Pressure     metFORMIN 500 MG tablet  Commonly known as:  GLUCOPHAGE  Take one tablet (500mg ) each morning before breakfast, and two tablets (1000mg ) at bedtime.   Indication:  Type 2 Diabetes     pantoprazole 40 MG tablet   Commonly known as:  PROTONIX  Take 1 tablet (40 mg total) by mouth daily.   Indication:  Gastroesophageal Reflux Disease     traZODone 100 MG tablet  Commonly known as:  DESYREL  Take 1 tablet (100 mg total) by mouth at bedtime.   Indication:  Trouble Sleeping           Follow-up Information   Follow up with [facility] On 12/30/2013. (Tuesday, December 30, 2012 between Quincy Carnes - Referral [phone])    Contact information:   [address] [place], [place]   [address]  [phone]      Follow-up recommendations:   Activities: Resume activity as tolerated. Diet: Heart healthy low sodium diet Tests: Follow up testing will be determined by your out patient provider. Comments:  He is to call on Monday for the referral to the Indigent clinic at [facility]  Total Discharge Time:  Greater than 30 minutes.  Signed: Verne Spurr 12/26/2013, 1:40 PM Personally evaluated the patient and agree with assessment and plan Madie Reno A. Dub Mikes, M.D.

## 2013-12-26 NOTE — [facility] Group Notes (Signed)
[facility]   12/26/2013 10:46 AM    Participation Quality:  Appropraite  Mood/Affect:  Appropriate  Depression Rating:  5  Anxiety Rating:   5  Thoughts of Suicide:  No  Will you contract for safety?   NA  Current AVH:  No  Plan for Discharge/Comments:  Patient attended discharge planning group and actively participated in group. [facility] [place]   CSW provided all participants with daily workbook.   Transportation Means: Patient has transportation.   Supports:  Patient has a support system.   Victor Lawrence, Victor Lawrence

## 2013-12-30 NOTE — Progress Notes (Signed)
Patient Discharge Instructions:  After Visit Summary (AVS):   Faxed to:  12/30/13 Discharge Summary Note:   Faxed to:  12/30/13 Psychiatric Admission Assessment Note:   Faxed to:  12/30/13 Suicide Risk Assessment - Discharge Assessment:   Faxed to:  12/30/13 Faxed/Sent to the Next Level Care provider:  12/30/13 [facility] @ [phone]  Jerelene ReddenSheena E , 12/30/2013, 2:18 PM

## 2015-01-12 IMAGING — CR DG CHEST 2V
2 series · 2 of 2 positions shown · non-contrast
Comparison: None.

CLINICAL DATA: Productive cough, history of hypertension.

EXAM:
CHEST  2 VIEW

[w chest pa]
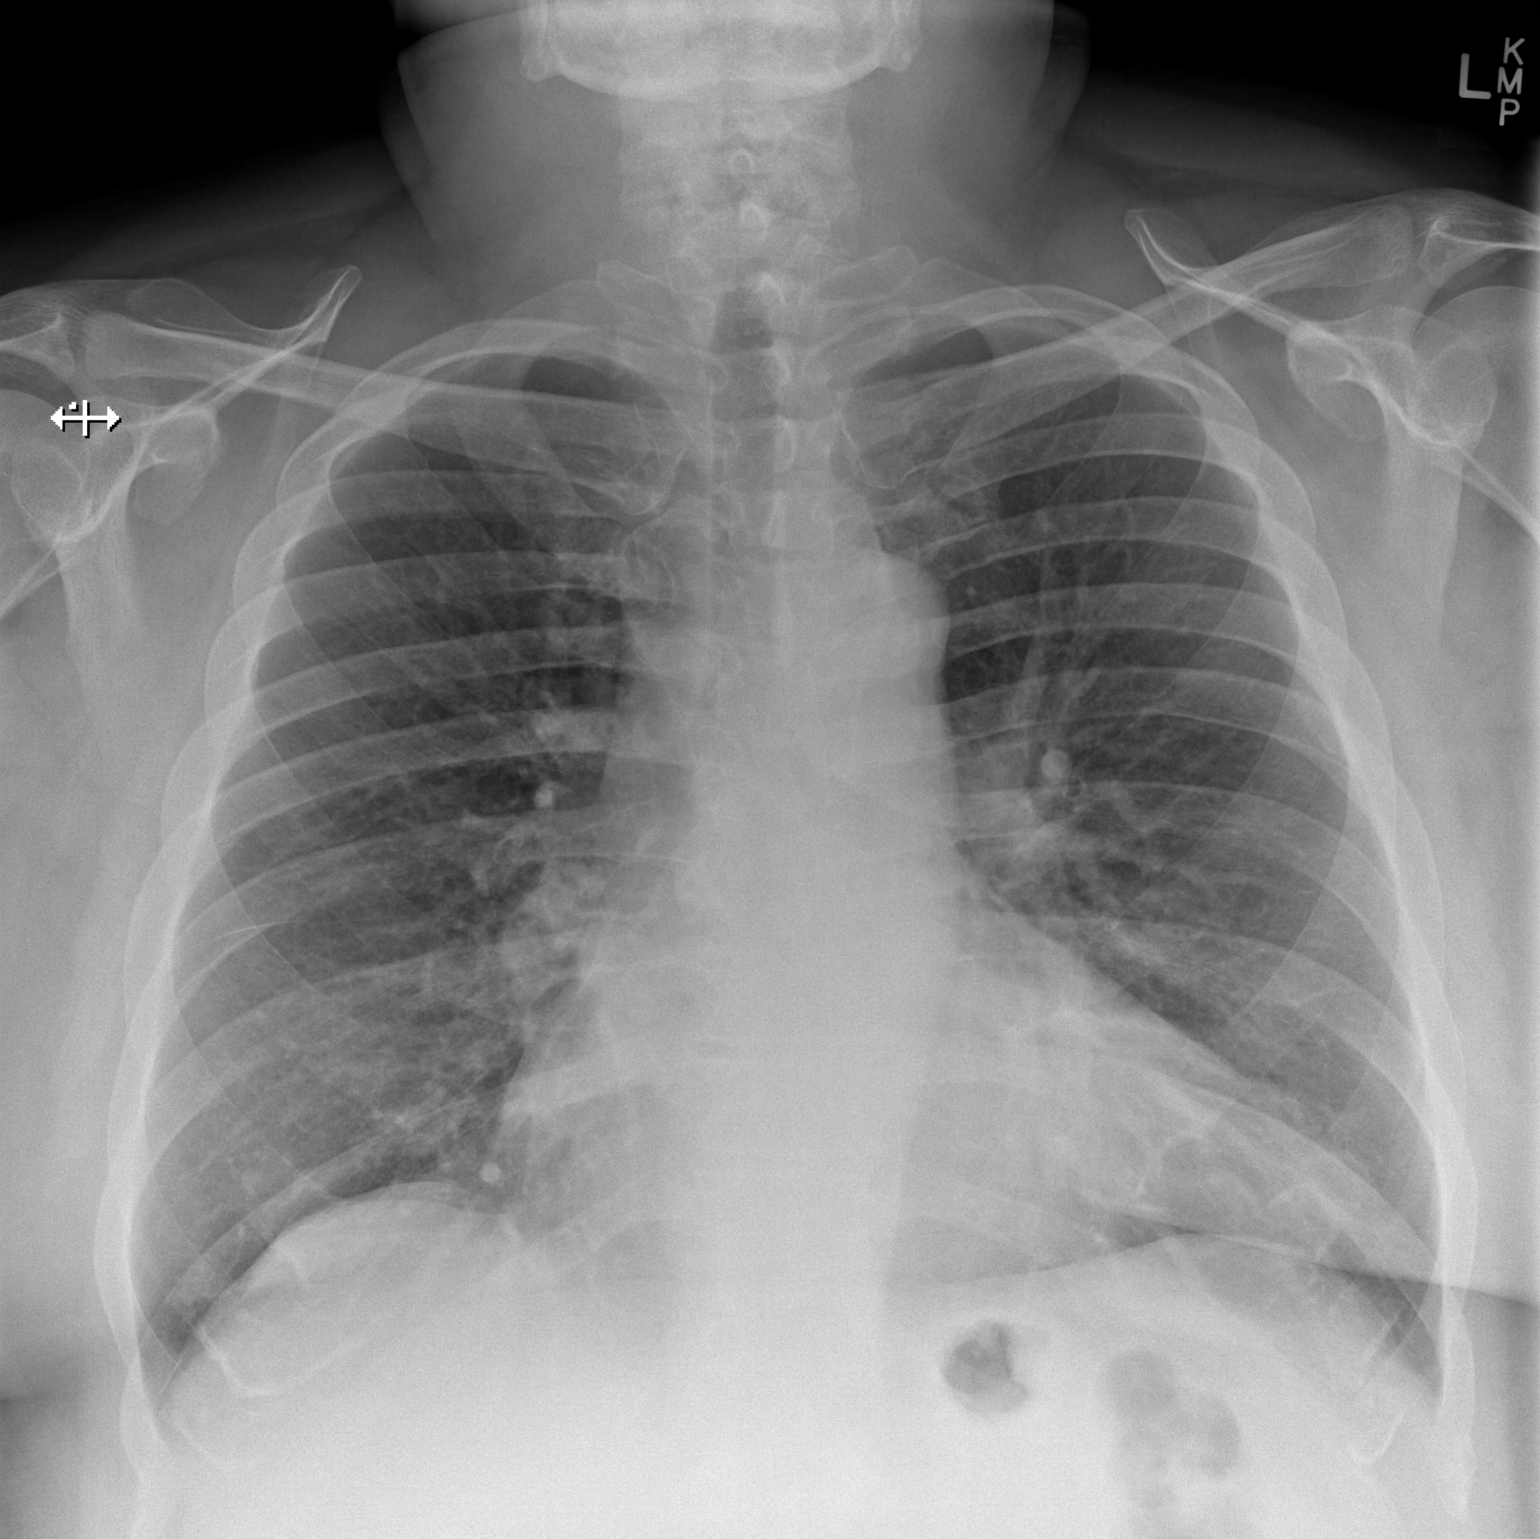

[w chest lat]
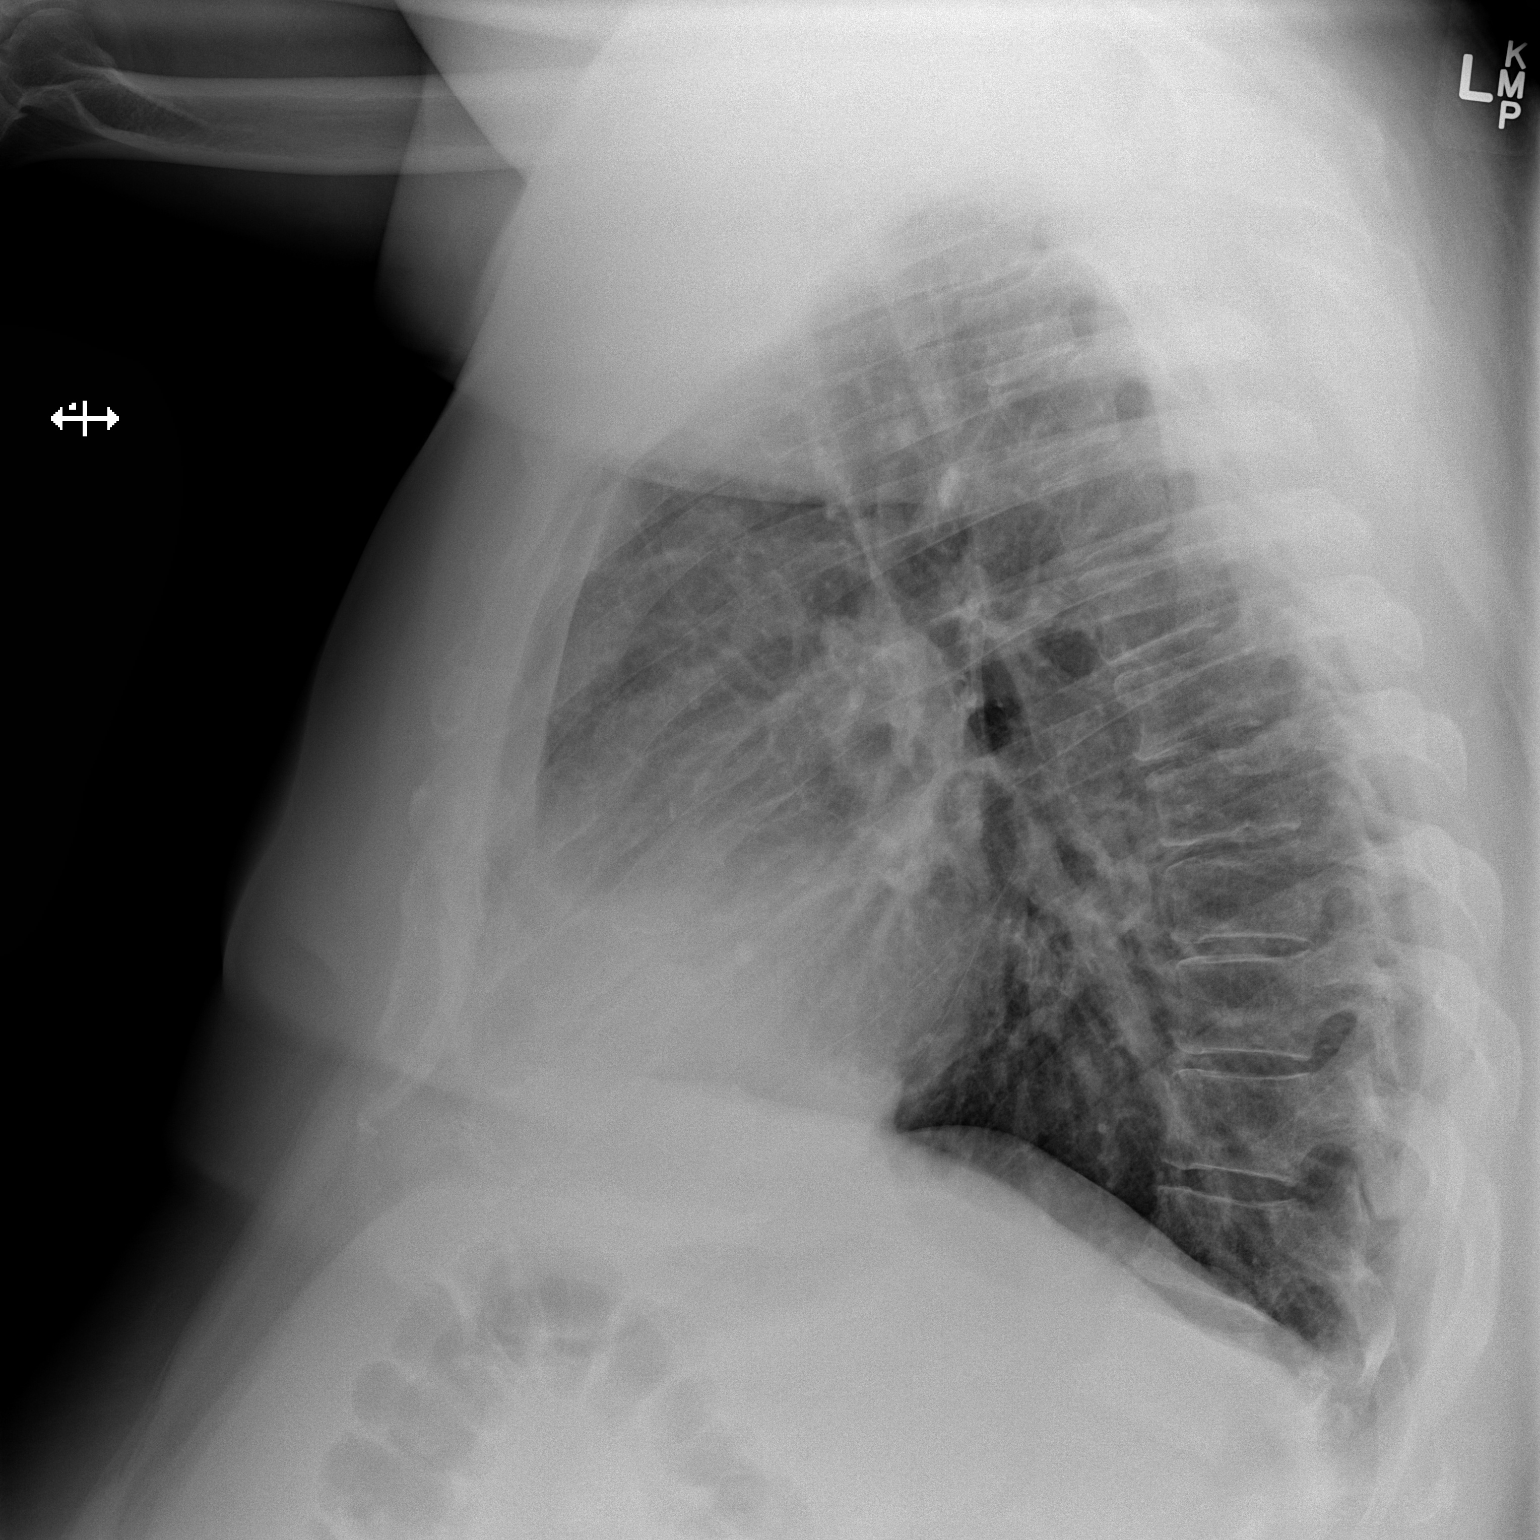

[2 of 2 positions shown; findings below may reference images not displayed]

FINDINGS: The lungs are clear and negative for focal airspace consolidation,
pulmonary edema or suspicious pulmonary nodule. No pleural effusion
or pneumothorax. Borderline cardiomegaly. Mediastinal contours are
within normal limits. Trace atherosclerotic calcification in the
transverse aorta. No acute fracture or lytic or blastic osseous
lesions. The visualized upper abdominal bowel gas pattern is
unremarkable.
IMPRESSION: No active cardiopulmonary disease.

Borderline cardiomegaly.

Trace aortic atherosclerosis.

## 2017-05-14 ENCOUNTER — Ambulatory Visit: Payer: Self-pay | Admitting: Family Medicine

## 2017-06-06 DIAGNOSIS — E119 Type 2 diabetes mellitus without complications: Secondary | ICD-10-CM | POA: Insufficient documentation

## 2017-06-06 DIAGNOSIS — F17219 Nicotine dependence, cigarettes, with unspecified nicotine-induced disorders: Secondary | ICD-10-CM | POA: Insufficient documentation

## 2017-06-06 DIAGNOSIS — I10 Essential (primary) hypertension: Secondary | ICD-10-CM | POA: Insufficient documentation

## 2017-06-06 DIAGNOSIS — E782 Mixed hyperlipidemia: Secondary | ICD-10-CM | POA: Insufficient documentation

## 2017-06-06 DIAGNOSIS — Z87442 Personal history of urinary calculi: Secondary | ICD-10-CM | POA: Insufficient documentation

## 2017-06-06 DIAGNOSIS — K219 Gastro-esophageal reflux disease without esophagitis: Secondary | ICD-10-CM | POA: Insufficient documentation

## 2017-06-08 DIAGNOSIS — J411 Mucopurulent chronic bronchitis: Secondary | ICD-10-CM | POA: Insufficient documentation

## 2017-07-06 DIAGNOSIS — N529 Male erectile dysfunction, unspecified: Secondary | ICD-10-CM | POA: Insufficient documentation

## 2017-07-06 DIAGNOSIS — G8929 Other chronic pain: Secondary | ICD-10-CM | POA: Insufficient documentation

## 2017-07-06 DIAGNOSIS — F1111 Opioid abuse, in remission: Secondary | ICD-10-CM | POA: Insufficient documentation

## 2017-07-06 DIAGNOSIS — M542 Cervicalgia: Secondary | ICD-10-CM

## 2017-07-06 DIAGNOSIS — Z6839 Body mass index (BMI) 39.0-39.9, adult: Secondary | ICD-10-CM

## 2017-09-12 ENCOUNTER — Ambulatory Visit (INDEPENDENT_AMBULATORY_CARE_PROVIDER_SITE_OTHER): Payer: Medicare Other | Admitting: Psychiatry

## 2017-09-12 ENCOUNTER — Encounter (HOSPITAL_COMMUNITY): Payer: Self-pay | Admitting: Psychiatry

## 2017-09-12 VITALS — BP 128/78 | HR 77 | Ht 69.0 in | Wt 287.4 lb

## 2017-09-12 DIAGNOSIS — F3130 Bipolar disorder, current episode depressed, mild or moderate severity, unspecified: Secondary | ICD-10-CM

## 2017-09-12 DIAGNOSIS — F1911 Other psychoactive substance abuse, in remission: Secondary | ICD-10-CM | POA: Diagnosis not present

## 2017-09-12 DIAGNOSIS — F419 Anxiety disorder, unspecified: Secondary | ICD-10-CM

## 2017-09-12 DIAGNOSIS — Z811 Family history of alcohol abuse and dependence: Secondary | ICD-10-CM | POA: Diagnosis not present

## 2017-09-12 DIAGNOSIS — F17210 Nicotine dependence, cigarettes, uncomplicated: Secondary | ICD-10-CM

## 2017-09-12 DIAGNOSIS — F5081 Binge eating disorder: Secondary | ICD-10-CM

## 2017-09-12 DIAGNOSIS — Z560 Unemployment, unspecified: Secondary | ICD-10-CM

## 2017-09-12 DIAGNOSIS — Z818 Family history of other mental and behavioral disorders: Secondary | ICD-10-CM | POA: Diagnosis not present

## 2017-09-12 MED ORDER — TRAZODONE HCL 50 MG PO TABS
50.0000 mg | ORAL_TABLET | Freq: Every evening | ORAL | 0 refills | Status: AC | PRN
Start: 2017-09-12 — End: ?

## 2017-09-12 MED ORDER — LAMOTRIGINE 25 MG PO TABS: ORAL_TABLET | ORAL | 1 refills | Status: DC

## 2017-09-12 NOTE — Progress Notes (Signed)
Psychiatric Initial Adult Assessment   Patient Identification: Victor Lawrence MRN:  [id number] Date of Evaluation:  09/12/2017 Referral Source: Self-referred. Chief Complaint:  I need to adjust my medication.  My medicine is not working.  I feel sad depressed irritable and have anxiety. Visit Diagnosis:    ICD-10-CM   1. Bipolar I disorder, most recent episode depressed (HCC) F31.30 lamoTRIgine (LAMICTAL) 25 MG tablet    History of Present Illness: Victor Lawrence is 57 year old Caucasian unemployed divorced man who is self-referred for seeking help.  Patient has a long history of mental illness.  He has diagnosed bipolar disorder, major depressive disorder, anxiety disorder in the past.  [facility]  He was discharged on Prozac and BuSpar.  After the discharge he went to [place] to visit his girlfriend and then moved to [place]  However he did not like [place]'s and his relationship ended last July he moved back to [place].  He is living with his sister.  He is taking Prozac 20 mg only even though prescribed 40 mg.  He is also taking BuSpar 7.5 mg twice a day.  Patient continues to struggle with anxiety, depression, irritability, mood swings, lack of focus and motivation.  Once he returned to [place] he tried to see physician at regular Center but he was disappointed because of the care he received there.  He mentioned his therapist called in few times and he was unable to see psychiatrist for a while.  His prescription continued by his primary care physician.  He admitted despite prescribed Prozac 40 mg he is only taking 20 mg.  He is sleeping on and off.  He denies any paranoia, hallucination, suicidal thoughts or homicidal thought.  He admitted anhedonia and sometimes feeling hopeless helpless and lack of energy and concentration.  He has a history of substance abuse but he claims to be sober for a while.  Patient has no children.  He lives  with his sister who also adopted him when his mother died.  Patient has multiple health issues including diabetes, hypertension, peripheral vascular disease, sleep apnea and history of kidney stones.  Patient is open to try a new medication to help his symptoms.  He admitted weight gain since he moved back to [place].  He believe he gain more than 40 pounds in 7 months.  He admitted binge eating and he also believe he has eating disorder.  Associated Signs/Symptoms: Depression Symptoms:  depressed mood, fatigue, difficulty concentrating, anxiety, loss of energy/fatigue, disturbed sleep, weight gain, (Hypo) Manic Symptoms:  Impulsivity, Irritable Mood, Labiality of Mood, Anxiety Symptoms:  Excessive Worry, Psychotic Symptoms:  No psychotic symptoms. PTSD Symptoms: Negative  Past Psychiatric History: Patient has at least 6 hospitalization in the past.  Most of the hospitalization was due to depression and manic symptoms.  He has been hospitalized in [place], [place], [place] in [place].  His last hospitalization was in July 2015 at [facility].  He has diagnosed with bipolar disorder, major depressive disorder and polysubstance use.  In the past he remember taking Risperdal, Depakote, Celexa, trazodone.  He denies any history of suicidal time but admitted strong suicidal thoughts, severe mood swing, mania and severe depression.  He also had a history of polysubstance use.  He admitted using cocaine, cannabis, pain medication and heavy drinking.  Previous Psychotropic Medications: Yes   Substance Abuse History in the last 12 months:  No.  Consequences of Substance Abuse: Patient  admitted due to his substance use he lost his career, marriage and his job.  Past Medical History:  Past Medical History:  Diagnosis Date  . Arthritis   . Depression   . Diabetes mellitus without complication (HCC)   . Hyperlipidemia   . Hypertension   . Kidney stones   .  Reflux   . Shortness of breath     Past Surgical History:  Procedure Laterality Date  . CHOLECYSTECTOMY    . CYSTOSCOPY    . TONSILLECTOMY      Family Psychiatric History: Patient sister has mental disorder.  Family History:  Family History  Problem Relation Age of Onset  . Alcohol abuse Father   . Mental illness Sister     Social History:   Social History   Socioeconomic History  . Marital status: Legally Separated    Spouse name: None  . Number of children: None  . Years of education: None  . Highest education level: None  Social Needs  . Financial resource strain: None  . Food insecurity - worry: None  . Food insecurity - inability: None  . Transportation needs - medical: None  . Transportation needs - non-medical: None  Occupational History  . None  Tobacco Use  . Smoking status: Current Every Day Smoker    Packs/day: 0.50    Years: 40.00    Pack years: 20.00    Types: Cigarettes  . Smokeless tobacco: Never Used  Substance and Sexual Activity  . Alcohol use: No    Frequency: Never  . Drug use: No  . Sexual activity: No  Other Topics Concern  . None  Social History Narrative  . None    Additional Social History: Patient born and raised in [place].  He traveled many places including overseas because he was a part of a concert.  He enjoys music.  He went to [place], [place] also travel many states of [place].  Currently he is on disability.  He married twice but both marriage follow-up R due to his drug use.  He has no children.  His mother died when he was only 57 years old and father died when he was 57 years old.  He has 3 sister.  He is very close to his sister who adopted him and currently he is living with her.  Allergies:  No Known Allergies  Metabolic Disorder Labs: Lab Results  Component Value Date   HGBA1C 11.4 (H) 12/24/2013   MPG 280 (H) 12/24/2013   MPG 240 (H) 04/26/2013   No results found for: PROLACTIN No results  found for: CHOL, TRIG, HDL, CHOLHDL, VLDL, LDLCALC   Current Medications: Current Outpatient Medications  Medication Sig Dispense Refill  . albuterol (PROVENTIL) (2.5 MG/3ML) 0.083% nebulizer solution Take 2.5 mg by nebulization every 6 (six) hours as needed for wheezing or shortness of breath.    . busPIRone (BUSPAR) 15 MG tablet Take 7.5 mg by mouth 2 (two) times daily.    . Dulaglutide 1.5 MG/0.5ML SOPN Inject into the skin.    Marland Kitchen. FLUoxetine (PROZAC) 20 MG capsule Take 1 capsule (20 mg total) by mouth daily. 30 capsule 0  . hydrochlorothiazide (HYDRODIURIL) 25 MG tablet Take 25 mg by mouth daily.    Marland Kitchen. lisinopril (PRINIVIL,ZESTRIL) 20 MG tablet Take 1 tablet (20 mg total) by mouth daily. 30 tablet 0  . metFORMIN (GLUCOPHAGE) 500 MG tablet Take one tablet (500mg ) each morning before breakfast, and two tablets (1000mg ) at bedtime. 90  tablet 0  . mometasone-formoterol (DULERA) 100-5 MCG/ACT AERO Inhale 2 puffs into the lungs 2 (two) times daily.    . pantoprazole (PROTONIX) 40 MG tablet Take 1 tablet (40 mg total) by mouth daily. 30 tablet 0  . pravastatin (PRAVACHOL) 40 MG tablet Take 20 mg by mouth daily.    . promethazine (PHENERGAN) 25 MG tablet Take 25 mg by mouth as needed for nausea or vomiting.    . lamoTRIgine (LAMICTAL) 25 MG tablet Take 1 tab daily for 1 week and than 2 tab daily 60 tablet 1   No current facility-administered medications for this visit.     Neurologic: Headache: No Seizure: No Paresthesias:No  Musculoskeletal: Strength & Muscle Tone: within normal limits Gait & Station: normal Patient leans: N/A  Psychiatric Specialty Exam: Review of Systems  Constitutional: Positive for malaise/fatigue. Negative for weight loss.  HENT: Negative.   Skin: Negative for itching and rash.  Neurological: Negative.     Blood pressure 128/78, pulse 77, height 5\' 9"  (1.753 m), weight 287 lb 6.4 oz (130.4 kg).Body mass index is 42.44 kg/m.  General Appearance: Casual and  Obese, multiple tattoos in his both arms  Eye Contact:  Good  Speech:  Normal Rate  Volume:  Increased  Mood:  Anxious and Depressed  Affect:  Constricted  Thought Process:  Goal Directed  Orientation:  Full (Time, Place, and Person)  Thought Content:  Rumination  Suicidal Thoughts:  No  Homicidal Thoughts:  No  Memory:  Immediate;   Good Recent;   Good Remote;   Good  Judgement:  Good  Insight:  Good  Psychomotor Activity:  Decreased  Concentration:  Concentration: Fair and Attention Span: Fair  Recall:  Good  Fund of Knowledge:Good  Language: Good  Akathisia:  No  Handed:  Right  AIMS (if indicated):  0  Assets:  Communication Skills Desire for Improvement Housing Resilience Social Support  ADL's:  Intact  Cognition: WNL  Sleep:  fair   Assessment: Bipolar disorder depressed type.  Major depressive disorder, recurrent.  Anxiety disorder NOS.  Polysubstance dependence in complete remission.  Binge eating disorder.  Plan: I review his symptoms, current medication, collect information from his primary care physician.  Patient is a 57 year old with history of mental illness and multiple health issues.  His last hemoglobin A1c is 7.2.  Currently he is taking Prozac 20 mg even though prescribed 40 mg.  I recommended he should take Prozac 40 mg as it is prescribed and continue BuSpar 7.5 mg twice a day.  I will add low-dose trazodone 50 mg which he had taken in the past for insomnia with good response.  I would also add low-dose Lamictal to help his mood lability.  We will start Lamictal 25 mg daily for 1 week and then 50 mg daily.  Explained medication side effect especially Lamictal can cause rash and in that case he need to stop the medication immediately.  We will also refer him for IOP.  Discussed safety concerns at any time having active suicidal thoughts or homicidal thought that he need to call 911 or go to local emergency room.  Encourage weight loss and healthy lifestyle.   Discusses binge eating and behavior modification and if not improve then we will consider medication management.  Follow-up in 4-6 weeks.  Upon completion of IOP he will need individual counseling for CBT.    Cleotis Nipper, MD 1/9/20199:51 AM

## 2017-09-17 ENCOUNTER — Other Ambulatory Visit (HOSPITAL_COMMUNITY): Payer: Medicare Other | Attending: Psychiatry | Admitting: Psychiatry

## 2017-09-17 ENCOUNTER — Encounter (HOSPITAL_COMMUNITY): Payer: Self-pay | Admitting: Psychiatry

## 2017-09-17 DIAGNOSIS — I10 Essential (primary) hypertension: Secondary | ICD-10-CM | POA: Diagnosis not present

## 2017-09-17 DIAGNOSIS — E119 Type 2 diabetes mellitus without complications: Secondary | ICD-10-CM | POA: Diagnosis not present

## 2017-09-17 DIAGNOSIS — Z811 Family history of alcohol abuse and dependence: Secondary | ICD-10-CM | POA: Diagnosis not present

## 2017-09-17 DIAGNOSIS — Z7984 Long term (current) use of oral hypoglycemic drugs: Secondary | ICD-10-CM | POA: Diagnosis not present

## 2017-09-17 DIAGNOSIS — R45851 Suicidal ideations: Secondary | ICD-10-CM | POA: Insufficient documentation

## 2017-09-17 DIAGNOSIS — F329 Major depressive disorder, single episode, unspecified: Secondary | ICD-10-CM | POA: Diagnosis present

## 2017-09-17 DIAGNOSIS — Z818 Family history of other mental and behavioral disorders: Secondary | ICD-10-CM | POA: Diagnosis not present

## 2017-09-17 DIAGNOSIS — Z79899 Other long term (current) drug therapy: Secondary | ICD-10-CM | POA: Diagnosis not present

## 2017-09-17 DIAGNOSIS — E785 Hyperlipidemia, unspecified: Secondary | ICD-10-CM | POA: Insufficient documentation

## 2017-09-17 DIAGNOSIS — F17210 Nicotine dependence, cigarettes, uncomplicated: Secondary | ICD-10-CM | POA: Insufficient documentation

## 2017-09-17 DIAGNOSIS — F319 Bipolar disorder, unspecified: Secondary | ICD-10-CM | POA: Insufficient documentation

## 2017-09-17 DIAGNOSIS — Z9049 Acquired absence of other specified parts of digestive tract: Secondary | ICD-10-CM | POA: Insufficient documentation

## 2017-09-17 DIAGNOSIS — F3130 Bipolar disorder, current episode depressed, mild or moderate severity, unspecified: Secondary | ICD-10-CM | POA: Insufficient documentation

## 2017-09-17 DIAGNOSIS — Z87442 Personal history of urinary calculi: Secondary | ICD-10-CM | POA: Diagnosis not present

## 2017-09-17 DIAGNOSIS — Z9889 Other specified postprocedural states: Secondary | ICD-10-CM | POA: Insufficient documentation

## 2017-09-17 DIAGNOSIS — M1990 Unspecified osteoarthritis, unspecified site: Secondary | ICD-10-CM | POA: Insufficient documentation
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
## 2017-09-17 NOTE — Progress Notes (Signed)
Psychiatric Initial Adult Assessment   Patient Identification: Victor Lawrence MRN:  [id number] Date of Evaluation:  09/17/2017 Referral Source: Dr Lolly MustacheArfeen Chief Complaint:depression  Visit Diagnosis: bipolar 1 depressed type  History of Present Illness:  Mr Victor Lawrence has been depressed for many years with a history of 6 inpatient stays, the last being in 2015.  He has a history of polysubstance abuse with no use for many years.  He also has been diagnosed with bipolar disorder.  Currently he is depressed with sadness, low motivation, decreased interest, tending to isolate, feeling guilty, finding little joy or pleasure in life and some suicidal thoughts without intent or plan.  Has been putting off getting help but says he has to do  Something because this is no kind of life.  He lives with his older sister who is his adoptive mother after his mother died when he was 3.  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  Not married and no children.  No clear history of mania but responded well to lurasidone well in the past he says.  Associated Signs/Symptoms: Depression Symptoms:  depressed mood, anhedonia, insomnia, hypersomnia, fatigue, feelings of worthlessness/guilt, difficulty concentrating, suicidal thoughts without plan, anxiety, weight gain, increased appetite, (Hypo) Manic Symptoms:  Irritable Mood, Anxiety Symptoms:  Excessive Worry, Psychotic Symptoms:  none PTSD Symptoms: Negative  Past Psychiatric History: inpatient and outpatient treatment over the years as well as being a counselor himself  Previous Psychotropic Medications: Yes   Substance Abuse History in the last 12 months:  No.  Consequences of Substance Abuse: history of DUI's, loss of property, jobs and family over the years  Past Medical History:  Past Medical History:  Diagnosis Date  . Arthritis   . Depression   . Diabetes mellitus without  complication (HCC)   . Hyperlipidemia   . Hypertension   . Kidney stones   . Reflux   . Shortness of breath     Past Surgical History:  Procedure Laterality Date  . CHOLECYSTECTOMY    . CYSTOSCOPY    . TONSILLECTOMY      Family Psychiatric History: men on father's side all alcoholics  Family History:  Family History  Problem Relation Age of Onset  . Alcohol abuse Father   . Mental illness Sister     Social History:   Social History   Socioeconomic History  . Marital status: Legally Separated    Spouse name: Not on file  . Number of children: Not on file  . Years of education: Not on file  . Highest education level: Not on file  Social Needs  . Financial resource strain: Not on file  . Food insecurity - worry: Not on file  . Food insecurity - inability: Not on file  . Transportation needs - medical: Not on file  . Transportation needs - non-medical: Not on file  Occupational History  . Not on file  Tobacco Use  . Smoking status: Current Every Day Smoker    Packs/day: 0.50    Years: 40.00    Pack years: 20.00    Types: Cigarettes  . Smokeless tobacco: Never Used  Substance and Sexual Activity  . Alcohol use: No    Frequency: Never  . Drug use: No  . Sexual activity: No  Other Topics Concern  . Not on file  Social History Narrative  . Not on file    Additional Social History: 2 marriages, the  last for many years but no contact with either former wife or step children  Allergies:  No Known Allergies  Metabolic Disorder Labs: Lab Results  Component Value Date   HGBA1C 11.4 (H) 12/24/2013   MPG 280 (H) 12/24/2013   MPG 240 (H) 04/26/2013   No results found for: PROLACTIN No results found for: CHOL, TRIG, HDL, CHOLHDL, VLDL, LDLCALC   Current Medications: Current Outpatient Medications  Medication Sig Dispense Refill  . albuterol (PROVENTIL) (2.5 MG/3ML) 0.083% nebulizer solution Take 2.5 mg by nebulization every 6 (six) hours as needed for  wheezing or shortness of breath.    . busPIRone (BUSPAR) 15 MG tablet Take 7.5 mg by mouth 2 (two) times daily.    . Dulaglutide 1.5 MG/0.5ML SOPN Inject into the skin.    Marland Kitchen FLUoxetine (PROZAC) 20 MG capsule Take 1 capsule (20 mg total) by mouth daily. 30 capsule 0  . hydrochlorothiazide (HYDRODIURIL) 25 MG tablet Take 25 mg by mouth daily.    Marland Kitchen lamoTRIgine (LAMICTAL) 25 MG tablet Take 1 tab daily for 1 week and than 2 tab daily 60 tablet 1  . lisinopril (PRINIVIL,ZESTRIL) 20 MG tablet Take 1 tablet (20 mg total) by mouth daily. 30 tablet 0  . metFORMIN (GLUCOPHAGE) 500 MG tablet Take one tablet (500mg ) each morning before breakfast, and two tablets (1000mg ) at bedtime. 90 tablet 0  . mometasone-formoterol (DULERA) 100-5 MCG/ACT AERO Inhale 2 puffs into the lungs 2 (two) times daily.    . pantoprazole (PROTONIX) 40 MG tablet Take 1 tablet (40 mg total) by mouth daily. 30 tablet 0  . pravastatin (PRAVACHOL) 40 MG tablet Take 20 mg by mouth daily.    . promethazine (PHENERGAN) 25 MG tablet Take 25 mg by mouth as needed for nausea or vomiting.    . traZODone (DESYREL) 50 MG tablet Take 1 tablet (50 mg total) by mouth at bedtime as needed for sleep. 30 tablet 0   No current facility-administered medications for this visit.     Neurologic: Headache: Negative Seizure: Negative Paresthesias:Negative  Musculoskeletal: Strength & Muscle Tone: within normal limits Gait & Station: normal Patient leans: N/A  Psychiatric Specialty Exam: ROS  There were no vitals taken for this visit.There is no height or weight on file to calculate BMI.  General Appearance: Well Groomed  Eye Contact:  Good  Speech:  Clear and Coherent  Volume:  Normal  Mood:  Depressed  Affect:  Congruent  Thought Process:  Coherent and Goal Directed  Orientation:  Full (Time, Place, and Person)  Thought Content:  Logical  Suicidal Thoughts:  No  Homicidal Thoughts:  No  Memory:  Immediate;   Good Recent;   Good Remote;    Good  Judgement:  Good  Insight:  Good  Psychomotor Activity:  Normal  Concentration:  Concentration: Good and Attention Span: Good  Recall:  Good  Fund of Knowledge:Good  Language: Good  Akathisia:  Negative  Handed:  Right  AIMS (if indicated):  0  Assets:  Communication Skills Desire for Improvement Financial Resources/Insurance Housing Resilience Social Support Talents/Skills Transportation Vocational/Educational  ADL's:  Intact  Cognition: WNL  Sleep:  poor    Treatment Plan Summary: Admit to IOP with daily group therapy   Carolanne Grumbling, MD 1/14/201911:47 AM

## 2017-09-17 NOTE — Progress Notes (Signed)
    Daily Group Progress Note  Program: IOP  Group Time: 9:00-12:00  Participation Level: Active  Behavioral Response: Appropriate  Type of Therapy:  Group Therapy  Summary of Progress: Pt. Presented as talkative, engaged in the group process. Pt.'s first day of group and met with the case manager and psychiatrist for intake assessment. Pt. Participated in medication management education group with the pharmacist. Pt. Shared his long-time struggle with depression with the group, struggles with isolation, guilt/regret regarding decisions that he has made in relationships and professionally. Pt. Expressed his desire to work on managing his emotions and openness to use of meditation to develop greater tolerance for difficult emotions.     Nancie Neas, LPC

## 2017-09-18 ENCOUNTER — Other Ambulatory Visit (HOSPITAL_COMMUNITY): Payer: Medicare Other | Admitting: Psychiatry

## 2017-09-18 DIAGNOSIS — F319 Bipolar disorder, unspecified: Secondary | ICD-10-CM

## 2017-09-18 DIAGNOSIS — F3130 Bipolar disorder, current episode depressed, mild or moderate severity, unspecified: Secondary | ICD-10-CM | POA: Diagnosis not present

## 2017-09-19 ENCOUNTER — Other Ambulatory Visit (HOSPITAL_COMMUNITY): Payer: Medicare Other | Admitting: Psychiatry

## 2017-09-19 DIAGNOSIS — F319 Bipolar disorder, unspecified: Secondary | ICD-10-CM

## 2017-09-19 DIAGNOSIS — F3130 Bipolar disorder, current episode depressed, mild or moderate severity, unspecified: Secondary | ICD-10-CM | POA: Diagnosis not present

## 2017-09-20 ENCOUNTER — Other Ambulatory Visit (HOSPITAL_COMMUNITY): Payer: Medicare Other | Admitting: Psychiatry

## 2017-09-20 DIAGNOSIS — F3130 Bipolar disorder, current episode depressed, mild or moderate severity, unspecified: Secondary | ICD-10-CM | POA: Diagnosis not present

## 2017-09-20 DIAGNOSIS — F319 Bipolar disorder, unspecified: Secondary | ICD-10-CM

## 2017-09-21 ENCOUNTER — Other Ambulatory Visit (HOSPITAL_COMMUNITY): Payer: Medicare Other | Admitting: Psychiatry

## 2017-09-21 DIAGNOSIS — F3130 Bipolar disorder, current episode depressed, mild or moderate severity, unspecified: Secondary | ICD-10-CM | POA: Diagnosis not present

## 2017-09-21 DIAGNOSIS — F319 Bipolar disorder, unspecified: Secondary | ICD-10-CM

## 2017-09-21 NOTE — Progress Notes (Signed)
    Daily Group Progress Note  Program: IOP  Group Time: 9:00-12:00  Participation Level: Active  Behavioral Response: Appropriate  Type of Therapy:  Group Therapy  Summary of Progress: Pt. Presents as talkative, engaged in the group process. Pt. Shared that he went to AA and was honest about his recent gambling and eating behaviors and concerns about going down path towards drinking and fears of relapse. Pt. Shared that he felt good about his disclosure and use of vulnerability. Pt. Shared that he felt angry about case manager telling him that he could not be paired with a therapist in this practice due to his insurance and waiting time. Pt. Listened to music selections offered by other group members that inspire them and helped them to describe their emotional states. Pt. Listened to reflection and suggestion for meditation from book of awakening.       Shaune PollackBrown, Jennifer B, LPC

## 2017-09-24 ENCOUNTER — Other Ambulatory Visit (HOSPITAL_COMMUNITY): Payer: Medicare Other | Admitting: Psychiatry

## 2017-09-24 DIAGNOSIS — F319 Bipolar disorder, unspecified: Secondary | ICD-10-CM

## 2017-09-24 DIAGNOSIS — F3130 Bipolar disorder, current episode depressed, mild or moderate severity, unspecified: Secondary | ICD-10-CM | POA: Diagnosis not present

## 2017-09-25 ENCOUNTER — Other Ambulatory Visit (HOSPITAL_COMMUNITY): Payer: Medicare Other

## 2017-09-25 NOTE — Progress Notes (Signed)
    Daily Group Progress Note  Program: IOP Time: 9:00-12:00  Type of Therapy:  Group Therapy   Participation Level:  Active   Participation Quality:  Appropriate   Affect:  Appropriate   Cognitive:  Appropriate   Insight:  Appropriate   Engagement in Group:  Engaged   Modes of Intervention:  Problem-solving  Victor Lawrence is 57 year old Caucasian unemployed divorced man who is self-referred for seeking help.  Patient has a long history of mental illness.  He has diagnosed bipolar disorder, major depressive disorder, anxiety disorder in the past. He has a history of substance abuse but he claims to be sober for a while.   He presented to Psych IOP. Counselor utilized therapeutic skills and motivational interviewing skills and group processing as a means for therapeutic intervention.  This first and second hour of today's session focused on "This past weekend" and the associated symptoms related to her depression. The last hour was an invited guest speaker (Chaplain/Bob). Patient reported having a frustrating weekend. He shared with group that he was angry to be present and states, "I asked to be in individual therapy not in group therapy". He further reported not trusting staff and his discomfort with how his treatment is being handle. Client was difficult to redirect as his focus was on being at Psych IOP. He did not report any specific symptoms related to his mental health diagnoses. He did state that because he is so angry he thinks of relapsing. He mentioned having a great support system identified as his sponsor.  Pt is dressed in street clothes, alert, oriented x4 with normal speech and normal motor behavior. Eye contact is fair. Pt's mood is angry and affect is congruent with mood. Thought process is coherent and relevant. Pt's insight is fair and judgement is fair. There is no indication Pt is currently responding to internal stimuli or experiencing delusional thought content. Pt was  cooperative throughout assessment.  Shaune PollackBrown, [name], LPC

## 2017-09-26 ENCOUNTER — Other Ambulatory Visit (HOSPITAL_COMMUNITY): Payer: Medicare Other

## 2017-09-26 NOTE — Progress Notes (Signed)
    Daily Group Progress Note  Program: IOP  Group Time: 9:00-12:00 Participation Level: Active Behavioral Response: Appropriate, Sharing Type of Therapy:  Group Summary of Progress: Group session focused on how past experiences can affect current moods and behaviors. Members shared present awareness on their current standpoints in life. Group session focused on correlating the past with how positive or negative thoughts are further shaped and processed. The session also focused on the results of goal setting, positive affirmations of change and relating to the experiences of other group members. Pt. expresses potential vulnerability to relapsing. He was very engaging towards other group members however appeared a bit bothered with shame and a sense of failure in his sobriety, habits and behaviors. Also expressed how experiences within the group may be having a negative effect on him. Nonetheless appears very humorous throughout the group session.  Shaune PollackBrown, Jennifer B, LPC

## 2017-09-26 NOTE — Progress Notes (Signed)
    Daily Group Progress Note  Program: IOP   Group Time: 9:00-12:00 Participation Level: Active Behavioral Response: Sharing  Type of Therapy:  GroupSummary of Progress: Group session was focused on normalizing experienced anger and distinguishing it with the other feelings associated with the anger. Session also focused on being how to become more comfortable with unpleasant emotions in order to develop productive emotional regulation. Lastly, topics of self care was addressed in terms of positively coping with negative life situations. Pt. was talkative and responsive towards his experiences as well as other group members. Positive mood and humor expressed throughout the session. Shared about his losses and upcoming neck surgery in a content manner. Also expressed genuine interest in the meditation and the continuation of the process.  Shaune PollackBrown, Karely Hurtado B, LPC

## 2017-09-27 ENCOUNTER — Other Ambulatory Visit (HOSPITAL_COMMUNITY): Payer: Medicare Other

## 2017-09-27 NOTE — Progress Notes (Signed)
BH IOP DISCHARGE NOTE  Patient:  Victor Lawrence DOB:  12/22/1960  Date of Admission: 09/17/2017  Date of Discharge:09/27/2017   Reason for Admission:depression   IOP Course:attended and participated initially but then decided he was not getting what he was promised and wants outpatient therapy and medication management  Mental Status at Discharge:not suicidal  Diagnosis:   Level of Care:  IOP  Discharge destination:     Comments:  none  The patient received suicide prevention pamphlet:  Yes   Carolanne GrumblingGerald Pearlie Nies, MD Patient ID: Victor BersBruce Ciaravino, male   DOB: 03/09/1961, 57 y.o.   MRN: [id number]

## 2017-09-28 ENCOUNTER — Other Ambulatory Visit (HOSPITAL_COMMUNITY): Payer: Medicare Other

## 2017-09-28 NOTE — Progress Notes (Signed)
Victor Lawrence is a 57 y.o. , separated, unemployed, Caucasian male, who was referred per Dr. Adele Schilder; treatment for worsening depressive symptoms with passive SI. Pt denies intent or plan.  Denied any prior attempts or gestures.  Discussed safety options with pt at length.  Pt is able to contract for safety.  Stated he has been depressed for 30 yrs.  Was diagnosed with MDD thirty yrs ago; then eventually d/x'd with Bipolar D/O.  Has had six prior psychiatric hospitalizations; most recent being 2015.  Hx of ploysubstance abuse.  Reports being clean and sober for years.  "You name it I have done it."  Pt currently attends daily NA meetings.  States he has a sponsor and a home group.  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  "I was helping so many people and even got an award, but I wasn't taking care of myself."  Hx of three DUI's (recent one was in 66').  Recently started back smoking cigarettes (1/2 pack a day).  States he had stopped for 73 days before restarting.  Trigger/Stressors:  Unresolved grief/loss issues:  Lost marriage and stepkids.  Estranged wife has met someone and has moved one and the stepkids hasn't contacted him.  "I practically raised them."  After relapsing, pt lost his job.  Currently on disability; but hopes to return to working in the field.  Resides with supportive older sister; who is his adoptive mother.  She and her husband adopted pt at age 62 when their mother died.  Family hx:  One sister dx'd with a mental illness (not sure which illness).  Pt only attended six days in IOP; left abruptly the last day attended upset about his aftercare plan.  Writer explained to pt that due his insurance [facility]) there wasn't anyone that could see him in this clinic.  The only social worker's schedule has been closed.  Offered to refer pt to another therapist; but he declined stating that the front office is incompetent.  "I called this office before I made an appt to see if I  would be able to have my psychiatrist and therapist in the same place and was told that I could; now you're telling me that I can't!"  Pt was yelling at [employer] and became very agitated and left.  Writer called pt later and he was still upset.  "I will just call my insurance company and find out where else I can go to."  A:  D/C today.  F/U with possibly Dr. Adele Schilder on 10-10-17; unless he transfer somewhere else.  Informed Drs. Lovena Le and Arfeen.                                                                                            Carlis Abbott, RITA, M.Ed,CNA

## 2017-10-01 ENCOUNTER — Other Ambulatory Visit (HOSPITAL_COMMUNITY): Payer: Medicare Other

## 2017-10-01 NOTE — Progress Notes (Signed)
    Daily Group Progress Note  Program: IOP  Group Time: 9:00-12:00  Participation Level: Active  Behavioral Response: Appropriate  Type of Therapy:  Group therapy  Summary of Progress: Pt. was talkative and engaged in the group process. Pt. shared with groups his ups and downs with addiction and his journey to do better.  He is very supportive of group members and enjoys the group experience. He was actively engaged and shared a lot of thoughts and insights with group. Pt. shared the Serenity Prayer, "The wisdom to know the difference" as the most difficult challenge for him. Pt. participated in grief and loss session with the Chaplain. The Chaplain explained the various ways people grieve and how difficult the grieving process can be, but can get better over time.  Shaune PollackBrown, Jennifer B, LPC

## 2017-10-02 ENCOUNTER — Other Ambulatory Visit (HOSPITAL_COMMUNITY): Payer: Medicare Other

## 2017-10-03 ENCOUNTER — Other Ambulatory Visit (HOSPITAL_COMMUNITY): Payer: Medicare Other

## 2017-10-04 ENCOUNTER — Other Ambulatory Visit (HOSPITAL_COMMUNITY): Payer: Medicare Other

## 2017-10-05 ENCOUNTER — Other Ambulatory Visit (HOSPITAL_COMMUNITY): Payer: Medicare Other

## 2017-10-08 ENCOUNTER — Other Ambulatory Visit (HOSPITAL_COMMUNITY): Payer: Medicare Other

## 2017-10-09 ENCOUNTER — Other Ambulatory Visit (HOSPITAL_COMMUNITY): Payer: Medicare Other

## 2017-10-10 ENCOUNTER — Other Ambulatory Visit (HOSPITAL_COMMUNITY): Payer: Self-pay | Admitting: Psychiatry

## 2017-10-10 ENCOUNTER — Ambulatory Visit (HOSPITAL_COMMUNITY): Payer: Self-pay | Admitting: Psychiatry

## 2017-10-10 ENCOUNTER — Other Ambulatory Visit (HOSPITAL_COMMUNITY): Payer: Medicare Other

## 2017-10-10 DIAGNOSIS — F3130 Bipolar disorder, current episode depressed, mild or moderate severity, unspecified: Secondary | ICD-10-CM

## 2017-10-11 ENCOUNTER — Other Ambulatory Visit (HOSPITAL_COMMUNITY): Payer: Medicare Other

## 2017-10-12 ENCOUNTER — Other Ambulatory Visit (HOSPITAL_COMMUNITY): Payer: Medicare Other

## 2017-10-15 ENCOUNTER — Other Ambulatory Visit (HOSPITAL_COMMUNITY): Payer: Medicare Other

## 2017-10-16 ENCOUNTER — Other Ambulatory Visit (HOSPITAL_COMMUNITY): Payer: Medicare Other

## 2017-10-17 ENCOUNTER — Other Ambulatory Visit (HOSPITAL_COMMUNITY): Payer: Medicare Other

## 2017-10-18 ENCOUNTER — Other Ambulatory Visit (HOSPITAL_COMMUNITY): Payer: Medicare Other

## 2017-10-19 ENCOUNTER — Other Ambulatory Visit (HOSPITAL_COMMUNITY): Payer: Medicare Other

## 2017-10-22 ENCOUNTER — Other Ambulatory Visit (HOSPITAL_COMMUNITY): Payer: Medicare Other

## 2017-10-23 ENCOUNTER — Other Ambulatory Visit (HOSPITAL_COMMUNITY): Payer: Medicare Other

## 2017-10-24 ENCOUNTER — Other Ambulatory Visit (HOSPITAL_COMMUNITY): Payer: Medicare Other

## 2017-10-25 ENCOUNTER — Other Ambulatory Visit (HOSPITAL_COMMUNITY): Payer: Medicare Other

## 2017-10-26 ENCOUNTER — Other Ambulatory Visit (HOSPITAL_COMMUNITY): Payer: Medicare Other

## 2017-10-29 ENCOUNTER — Other Ambulatory Visit (HOSPITAL_COMMUNITY): Payer: Medicare Other

## 2017-10-30 ENCOUNTER — Other Ambulatory Visit (HOSPITAL_COMMUNITY): Payer: Medicare Other

## 2017-10-31 ENCOUNTER — Other Ambulatory Visit (HOSPITAL_COMMUNITY): Payer: Medicare Other

## 2017-11-01 ENCOUNTER — Other Ambulatory Visit (HOSPITAL_COMMUNITY): Payer: Medicare Other

## 2017-11-02 ENCOUNTER — Other Ambulatory Visit (HOSPITAL_COMMUNITY): Payer: Medicare Other

## 2017-11-05 ENCOUNTER — Other Ambulatory Visit (HOSPITAL_COMMUNITY): Payer: Medicare Other

## 2017-11-06 ENCOUNTER — Other Ambulatory Visit (HOSPITAL_COMMUNITY): Payer: Medicare Other

## 2017-11-07 ENCOUNTER — Other Ambulatory Visit (HOSPITAL_COMMUNITY): Payer: Medicare Other

## 2017-11-08 ENCOUNTER — Other Ambulatory Visit (HOSPITAL_COMMUNITY): Payer: Medicare Other

## 2017-11-09 ENCOUNTER — Other Ambulatory Visit (HOSPITAL_COMMUNITY): Payer: Medicare Other

## 2019-12-24 ENCOUNTER — Institutional Professional Consult (permissible substitution): Payer: Self-pay | Admitting: Pulmonary Disease

## 2020-01-13 ENCOUNTER — Encounter: Payer: Self-pay | Admitting: Pulmonary Disease

## 2020-01-13 ENCOUNTER — Other Ambulatory Visit: Payer: Self-pay

## 2020-01-13 ENCOUNTER — Ambulatory Visit (INDEPENDENT_AMBULATORY_CARE_PROVIDER_SITE_OTHER): Payer: Medicare Other | Admitting: Pulmonary Disease

## 2020-01-13 VITALS — BP 116/64 | HR 74 | Temp 97.4°F | Ht 70.5 in | Wt 261.0 lb

## 2020-01-13 DIAGNOSIS — R0602 Shortness of breath: Secondary | ICD-10-CM

## 2020-01-13 MED ORDER — TRELEGY ELLIPTA 100-62.5-25 MCG/INH IN AEPB: 1.0000 | INHALATION_SPRAY | Freq: Every day | RESPIRATORY_TRACT | 0 refills | Status: AC

## 2020-01-13 MED ORDER — TRELEGY ELLIPTA 100-62.5-25 MCG/INH IN AEPB: 1.0000 | INHALATION_SPRAY | Freq: Every day | RESPIRATORY_TRACT | 5 refills | Status: AC

## 2020-01-13 NOTE — Patient Instructions (Signed)
Switch from [facility] to Trelegy  Continue CPAP use  We will get a download from your machine  Continue working on smoking cessation  I will see you in 3 months

## 2020-01-13 NOTE — Addendum Note (Signed)
Addended by: Vianne Bulls R on: 01/13/2020 12:20 PM   Modules accepted: Orders

## 2020-01-13 NOTE — Progress Notes (Addendum)
Victor Lawrence    [id number]    October 24, 1960  Primary Care Physician:Falstreau, Blaine Hamper, NP  Referring Physician: Nolene Ebbs, MD [address] [place],  [place] [address]  Chief complaint:   Patient with a history of shortness of breath, active smoker, obstructive sleep apnea  HPI:  Sleep apnea diagnosed about 2 -3 years ago, compliant with CPAP use benefiting from CPAP  He is an active smoker about a pack a day, quit date is 01/14/2020 He has managed to quit over 2 years in the past Most recently he is quit for a few months before picking up cigarettes again  Shortness of breath, cough, wheezing No feeling acutely ill at present  History of behavioral problems which are better controlled at present  Outpatient Encounter Medications as of 01/13/2020  Medication Sig  . busPIRone (BUSPAR) 15 MG tablet Take 7.5 mg by mouth 2 (two) times daily.  . Dulaglutide 1.5 MG/0.5ML SOPN Inject 0.75 mg into the skin.   Marland Kitchen FLUoxetine (PROZAC) 20 MG capsule Take 1 capsule (20 mg total) by mouth daily. (Patient taking differently: Take 40 mg by mouth daily. )  . hydrochlorothiazide (HYDRODIURIL) 25 MG tablet Take 25 mg by mouth daily.  Marland Kitchen lurasidone (LATUDA) 40 MG TABS tablet Take 40 mg by mouth daily with breakfast.  . metFORMIN (GLUCOPHAGE) 500 MG tablet Take one tablet (500mg ) each morning before breakfast, and two tablets (1000mg ) at bedtime.  . mometasone-formoterol (DULERA) 100-5 MCG/ACT AERO Inhale 2 puffs into the lungs 2 (two) times daily.  . pravastatin (PRAVACHOL) 40 MG tablet Take 20 mg by mouth daily.  . traZODone (DESYREL) 50 MG tablet Take 1 tablet (50 mg total) by mouth at bedtime as needed for sleep.  . [DISCONTINUED] lamoTRIgine (LAMICTAL) 25 MG tablet Take 1 tab daily for 1 week and than 2 tab daily  . [DISCONTINUED] lisinopril (PRINIVIL,ZESTRIL) 20 MG tablet Take 1 tablet (20 mg total) by mouth daily.  . [DISCONTINUED] pantoprazole (PROTONIX) 40 MG  tablet Take 1 tablet (40 mg total) by mouth daily.  . [DISCONTINUED] promethazine (PHENERGAN) 25 MG tablet Take 25 mg by mouth as needed for nausea or vomiting.  Marland Kitchen albuterol (PROVENTIL) (2.5 MG/3ML) 0.083% nebulizer solution Take 2.5 mg by nebulization every 6 (six) hours as needed for wheezing or shortness of breath.   No facility-administered encounter medications on file as of 01/13/2020.    Allergies as of 01/13/2020  . (No Known Allergies)    Past Medical History:  Diagnosis Date  . Arthritis   . Depression   . Diabetes mellitus without complication ([place])   . Hyperlipidemia   . Hypertension   . Kidney stones   . Reflux   . Shortness of breath     Past Surgical History:  Procedure Laterality Date  . CHOLECYSTECTOMY    . CYSTOSCOPY    . TONSILLECTOMY      Family History  Problem Relation Age of Onset  . Alcohol abuse Father   . Mental illness Sister     Social History   Socioeconomic History  . Marital status: Legally Separated    Spouse name: Not on file  . Number of children: 0  . Years of education: Not on file  . Highest education level: 12th grade  Occupational History  . Not on file  Tobacco Use  . Smoking status: Current Every Day Smoker    Packs/day: 1.50    Years: 40.00  Pack years: 60.00    Types: Cigarettes  . Smokeless tobacco: Never Used  Substance and Sexual Activity  . Alcohol use: No  . Drug use: No    Types: Marijuana  . Sexual activity: Never  Other Topics Concern  . Not on file  Social History Narrative  . Not on file   Social Determinants of Health   Financial Resource Strain:   . Difficulty of Paying Living Expenses:   Food Insecurity:   . Worried About Programme researcher, broadcasting/film/video in the Last Year:   . Barista in the Last Year:   Transportation Needs:   . Freight forwarder (Medical):   Marland Kitchen Lack of Transportation (Non-Medical):   Physical Activity:   . Days of Exercise per Week:   . Minutes of Exercise per Session:    Stress:   . Feeling of Stress :   Social Connections:   . Frequency of Communication with Friends and Family:   . Frequency of Social Gatherings with Friends and Family:   . Attends Religious Services:   . Active Member of Clubs or Organizations:   . Attends Banker Meetings:   Marland Kitchen Marital Status:   Intimate Partner Violence:   . Fear of Current or Ex-Partner:   . Emotionally Abused:   Marland Kitchen Physically Abused:   . Sexually Abused:     Review of Systems  Respiratory: Positive for apnea, cough and shortness of breath.   Psychiatric/Behavioral: Positive for sleep disturbance.  All other systems reviewed and are negative.   Vitals:   01/13/20 1113  BP: 116/64  Pulse: 74  Temp: (!) 97.4 F (36.3 C)  SpO2: 96%     Physical Exam  Constitutional: He appears well-developed.  Obese  HENT:  Head: Normocephalic.  Eyes: Pupils are equal, round, and reactive to light. Right eye exhibits no discharge. Left eye exhibits no discharge.  Neck: No tracheal deviation present. No thyromegaly present.  Cardiovascular: Normal rate and regular rhythm.  Pulmonary/Chest: Effort normal. No respiratory distress. He has wheezes. He has no rales. He exhibits no tenderness.  Musculoskeletal:        General: Normal range of motion.  Neurological: He is alert.  Psychiatric: He has a normal mood and affect.   Assessment:  Obstructive sleep apnea -CPAP currently set at 16 -Compliant with CPAP use -Continues to benefit from CPAP  Likely obstructive lung disease -No PFTs recently  Plan/Recommendations: Obtain PFT  Switch to Trelegy from [facility]  Continue CPAP use  We will try to get download from [facility]  Call with significant concerns  Follow-up in 3 months  Smoking cessation counseling   Virl Diamond MD [facility] 01/13/2020, 11:33 AM  CC: Penni Bombard, MD  Compliance data reviewed showing 100% compliance CPAP is set at 16 AHI of  0.9

## 2020-02-21 ENCOUNTER — Inpatient Hospital Stay (HOSPITAL_COMMUNITY): Admission: RE | Admit: 2020-02-21 | Payer: Medicare Other | Source: Ambulatory Visit

## 2020-02-25 ENCOUNTER — Other Ambulatory Visit: Payer: Self-pay

## 2020-02-25 ENCOUNTER — Ambulatory Visit: Payer: Medicare Other

## 2020-05-23 ENCOUNTER — Other Ambulatory Visit: Payer: Self-pay | Admitting: Pulmonary Disease
